# Patient Record
Sex: Female | Born: 1960 | Race: Black or African American | Hispanic: No | Marital: Married | Smoking: Never smoker
Health system: Southern US, Community
[De-identification: ages and names within clinical notes are randomized; demographics above are authoritative.]

## PROBLEM LIST (undated history)

## (undated) DIAGNOSIS — J45909 Unspecified asthma, uncomplicated: Secondary | ICD-10-CM

## (undated) DIAGNOSIS — K439 Ventral hernia without obstruction or gangrene: Secondary | ICD-10-CM

## (undated) DIAGNOSIS — Z923 Personal history of irradiation: Secondary | ICD-10-CM

## (undated) DIAGNOSIS — C50919 Malignant neoplasm of unspecified site of unspecified female breast: Secondary | ICD-10-CM

## (undated) DIAGNOSIS — K219 Gastro-esophageal reflux disease without esophagitis: Secondary | ICD-10-CM

## (undated) DIAGNOSIS — E039 Hypothyroidism, unspecified: Secondary | ICD-10-CM

## (undated) DIAGNOSIS — C801 Malignant (primary) neoplasm, unspecified: Secondary | ICD-10-CM

## (undated) HISTORY — PX: ENDOMETRIAL BIOPSY: SHX622

## (undated) HISTORY — PX: HERNIA REPAIR: SHX51

## (undated) HISTORY — DX: Unspecified asthma, uncomplicated: J45.909

## (undated) HISTORY — PX: COLONOSCOPY: SHX174

---

## 2005-03-19 ENCOUNTER — Ambulatory Visit: Payer: Self-pay | Admitting: Family Medicine

## 2006-10-21 ENCOUNTER — Other Ambulatory Visit: Payer: Self-pay

## 2006-10-21 ENCOUNTER — Ambulatory Visit: Payer: Self-pay | Admitting: Surgery

## 2006-10-25 ENCOUNTER — Ambulatory Visit: Payer: Self-pay | Admitting: Surgery

## 2009-02-10 ENCOUNTER — Ambulatory Visit: Payer: Self-pay | Admitting: Family Medicine

## 2010-02-20 ENCOUNTER — Ambulatory Visit: Payer: Self-pay | Admitting: Family Medicine

## 2011-02-22 ENCOUNTER — Ambulatory Visit: Payer: Self-pay | Admitting: Family Medicine

## 2012-02-26 ENCOUNTER — Ambulatory Visit: Payer: Self-pay

## 2013-03-11 ENCOUNTER — Ambulatory Visit: Payer: Self-pay

## 2013-03-21 DIAGNOSIS — E669 Obesity, unspecified: Secondary | ICD-10-CM | POA: Insufficient documentation

## 2013-03-21 DIAGNOSIS — J309 Allergic rhinitis, unspecified: Secondary | ICD-10-CM | POA: Insufficient documentation

## 2014-03-12 ENCOUNTER — Ambulatory Visit: Payer: Self-pay | Admitting: Family Medicine

## 2015-02-10 ENCOUNTER — Other Ambulatory Visit: Payer: Self-pay | Admitting: Family Medicine

## 2015-02-10 DIAGNOSIS — Z1231 Encounter for screening mammogram for malignant neoplasm of breast: Secondary | ICD-10-CM

## 2015-03-16 ENCOUNTER — Ambulatory Visit
Admission: RE | Admit: 2015-03-16 | Discharge: 2015-03-16 | Disposition: A | Discharge status: Home / Self Care | Payer: BC Managed Care – PPO | Source: Ambulatory Visit | Attending: Family Medicine | Admitting: Family Medicine

## 2015-03-16 DIAGNOSIS — Z1231 Encounter for screening mammogram for malignant neoplasm of breast: Secondary | ICD-10-CM | POA: Diagnosis not present

## 2016-02-10 ENCOUNTER — Other Ambulatory Visit: Payer: Self-pay | Admitting: Family Medicine

## 2016-02-10 DIAGNOSIS — Z1231 Encounter for screening mammogram for malignant neoplasm of breast: Secondary | ICD-10-CM

## 2016-03-19 ENCOUNTER — Ambulatory Visit: Payer: BC Managed Care – PPO

## 2016-03-26 ENCOUNTER — Ambulatory Visit
Admission: RE | Admit: 2016-03-26 | Discharge: 2016-03-26 | Disposition: A | Discharge status: Home / Self Care | Payer: BC Managed Care – PPO | Source: Ambulatory Visit | Attending: Family Medicine | Admitting: Family Medicine

## 2016-03-26 ENCOUNTER — Other Ambulatory Visit: Payer: Self-pay | Admitting: Family Medicine

## 2016-03-26 DIAGNOSIS — Z1231 Encounter for screening mammogram for malignant neoplasm of breast: Secondary | ICD-10-CM

## 2017-01-15 ENCOUNTER — Other Ambulatory Visit: Payer: Self-pay | Admitting: Internal Medicine

## 2017-01-15 DIAGNOSIS — E0590 Thyrotoxicosis, unspecified without thyrotoxic crisis or storm: Secondary | ICD-10-CM

## 2017-01-22 ENCOUNTER — Encounter
Admission: RE | Admit: 2017-01-22 | Discharge: 2017-01-22 | Disposition: A | Discharge status: Home / Self Care | Payer: BC Managed Care – PPO | Source: Ambulatory Visit | Attending: Internal Medicine | Admitting: Internal Medicine

## 2017-01-22 ENCOUNTER — Other Ambulatory Visit: Payer: Self-pay | Admitting: Internal Medicine

## 2017-01-22 ENCOUNTER — Ambulatory Visit
Admission: RE | Admit: 2017-01-22 | Discharge: 2017-01-22 | Disposition: A | Discharge status: Home / Self Care | Payer: BC Managed Care – PPO | Source: Ambulatory Visit | Attending: Internal Medicine | Admitting: Internal Medicine

## 2017-01-22 DIAGNOSIS — E0590 Thyrotoxicosis, unspecified without thyrotoxic crisis or storm: Secondary | ICD-10-CM

## 2017-01-22 MED ORDER — SODIUM IODIDE I-123 7.4 MBQ CAPS
143.8250 | ORAL_CAPSULE | Freq: Once | ORAL | Status: AC
  Administered 2017-01-22: 143.825 via ORAL

## 2017-01-23 ENCOUNTER — Encounter
Admission: RE | Admit: 2017-01-23 | Discharge: 2017-01-23 | Disposition: A | Discharge status: Home / Self Care | Payer: BC Managed Care – PPO | Source: Ambulatory Visit | Attending: Internal Medicine | Admitting: Internal Medicine

## 2017-02-06 ENCOUNTER — Other Ambulatory Visit: Payer: Self-pay | Admitting: Family Medicine

## 2017-02-06 DIAGNOSIS — Z1239 Encounter for other screening for malignant neoplasm of breast: Secondary | ICD-10-CM

## 2017-02-13 ENCOUNTER — Other Ambulatory Visit: Payer: Self-pay | Admitting: Internal Medicine

## 2017-02-13 DIAGNOSIS — E0590 Thyrotoxicosis, unspecified without thyrotoxic crisis or storm: Secondary | ICD-10-CM

## 2017-02-26 ENCOUNTER — Ambulatory Visit
Admission: RE | Admit: 2017-02-26 | Discharge: 2017-02-26 | Disposition: A | Discharge status: Home / Self Care | Payer: BC Managed Care – PPO | Source: Ambulatory Visit | Attending: Internal Medicine | Admitting: Internal Medicine

## 2017-02-26 DIAGNOSIS — E041 Nontoxic single thyroid nodule: Secondary | ICD-10-CM | POA: Insufficient documentation

## 2017-02-26 DIAGNOSIS — E0590 Thyrotoxicosis, unspecified without thyrotoxic crisis or storm: Secondary | ICD-10-CM | POA: Insufficient documentation

## 2017-02-26 MED ORDER — SODIUM IODIDE I 131 CAPSULE
25.7000 | Freq: Once | INTRAVENOUS | Status: AC | PRN
  Administered 2017-02-26: 25.7 via ORAL

## 2017-03-22 ENCOUNTER — Other Ambulatory Visit: Payer: Self-pay | Admitting: Family Medicine

## 2017-03-22 DIAGNOSIS — N950 Postmenopausal bleeding: Secondary | ICD-10-CM

## 2017-03-26 ENCOUNTER — Other Ambulatory Visit: Payer: Self-pay | Admitting: Family Medicine

## 2017-03-26 DIAGNOSIS — N950 Postmenopausal bleeding: Secondary | ICD-10-CM

## 2017-03-27 ENCOUNTER — Ambulatory Visit
Admission: RE | Admit: 2017-03-27 | Discharge: 2017-03-27 | Disposition: A | Discharge status: Home / Self Care | Payer: BC Managed Care – PPO | Source: Ambulatory Visit | Attending: Family Medicine | Admitting: Family Medicine

## 2017-03-27 DIAGNOSIS — Z1231 Encounter for screening mammogram for malignant neoplasm of breast: Secondary | ICD-10-CM | POA: Insufficient documentation

## 2017-03-27 DIAGNOSIS — Z1239 Encounter for other screening for malignant neoplasm of breast: Secondary | ICD-10-CM

## 2017-04-02 ENCOUNTER — Ambulatory Visit
Admission: RE | Admit: 2017-04-02 | Discharge: 2017-04-02 | Disposition: A | Discharge status: Home / Self Care | Payer: BC Managed Care – PPO | Source: Ambulatory Visit | Attending: Family Medicine | Admitting: Family Medicine

## 2017-04-02 DIAGNOSIS — N950 Postmenopausal bleeding: Secondary | ICD-10-CM | POA: Diagnosis not present

## 2017-04-02 DIAGNOSIS — R938 Abnormal findings on diagnostic imaging of other specified body structures: Secondary | ICD-10-CM | POA: Diagnosis not present

## 2018-02-13 ENCOUNTER — Other Ambulatory Visit: Payer: Self-pay | Admitting: Family Medicine

## 2018-02-13 DIAGNOSIS — Z1231 Encounter for screening mammogram for malignant neoplasm of breast: Secondary | ICD-10-CM

## 2018-04-01 ENCOUNTER — Ambulatory Visit
Admission: RE | Admit: 2018-04-01 | Discharge: 2018-04-01 | Disposition: A | Discharge status: Home / Self Care | Payer: BC Managed Care – PPO | Source: Ambulatory Visit | Attending: Family Medicine | Admitting: Family Medicine

## 2018-04-01 DIAGNOSIS — Z1231 Encounter for screening mammogram for malignant neoplasm of breast: Secondary | ICD-10-CM | POA: Diagnosis present

## 2019-02-02 ENCOUNTER — Other Ambulatory Visit: Payer: Self-pay | Admitting: Family Medicine

## 2019-02-02 DIAGNOSIS — Z1231 Encounter for screening mammogram for malignant neoplasm of breast: Secondary | ICD-10-CM

## 2019-02-13 IMAGING — US US PELVIS COMPLETE
1 series · 13 of 25 positions shown · non-contrast
Comparison: None

CLINICAL DATA: Postmenopausal bleeding

EXAM:
TRANSABDOMINAL AND TRANSVAGINAL ULTRASOUND OF PELVIS
TECHNIQUE: Both transabdominal and transvaginal ultrasound examinations of the
pelvis were performed. Transabdominal technique was performed for
global imaging of the pelvis including uterus, ovaries, adnexal
regions, and pelvic cul-de-sac. It was necessary to proceed with
endovaginal exam following the transabdominal exam to visualize the
ovaries and endometrium.

[Series 1: us pelvis complete · 0.24mm/px · 13 of 69 slices shown]
[im 1/69]
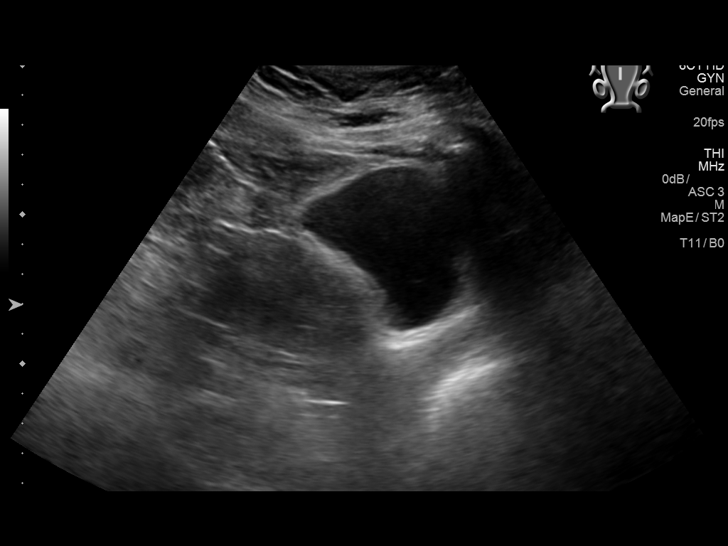
[im 6/69]
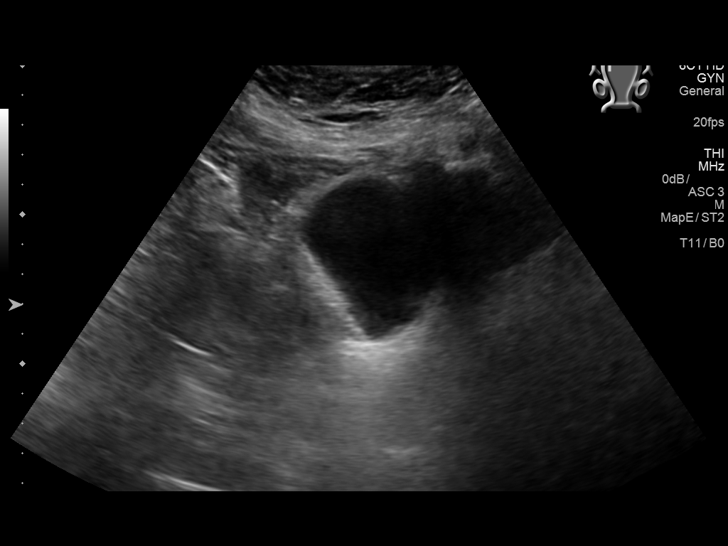
[im 12/69]
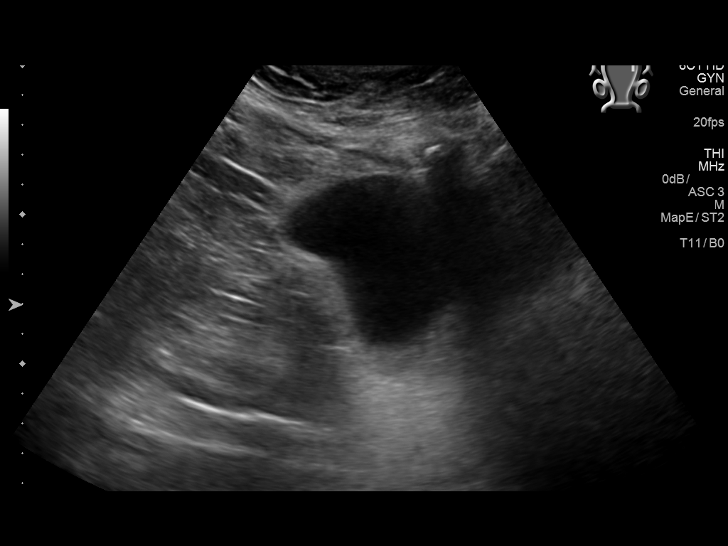
[im 18/69]
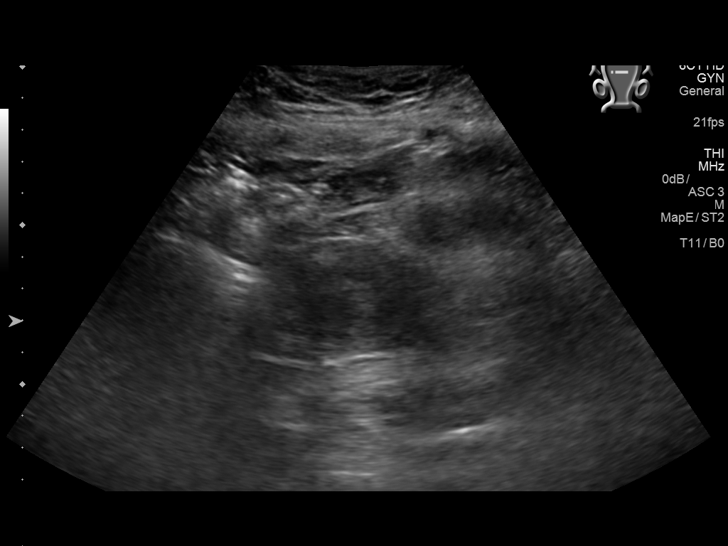
[im 23/69]
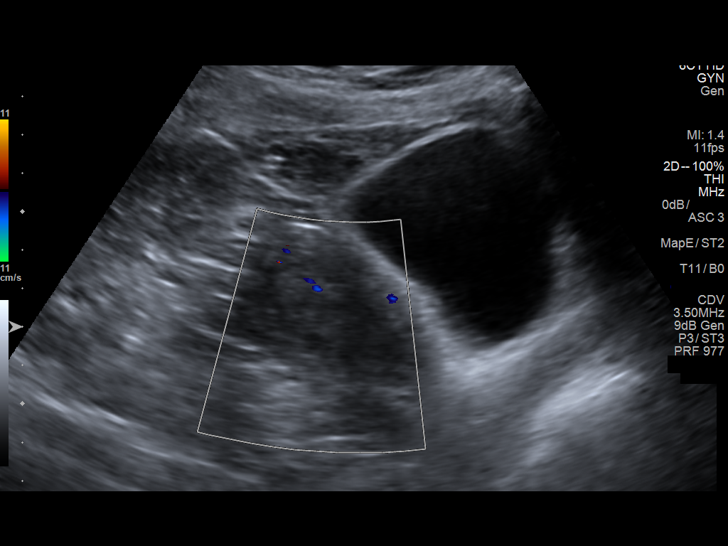
[im 29/69]
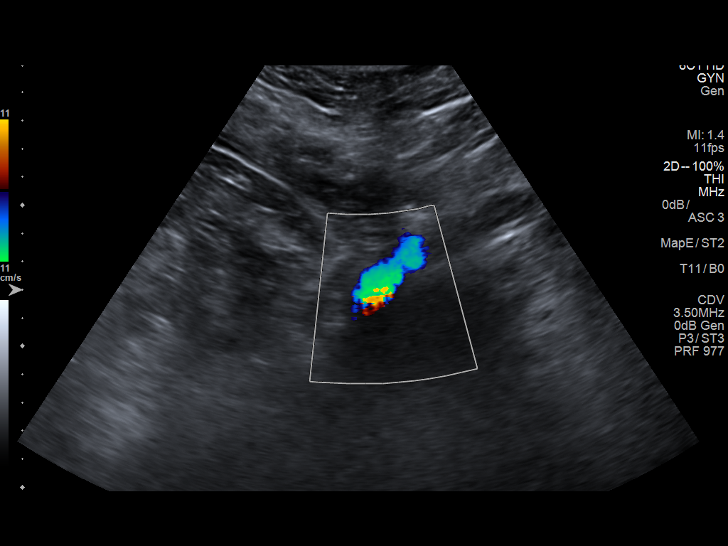
[im 35/69]
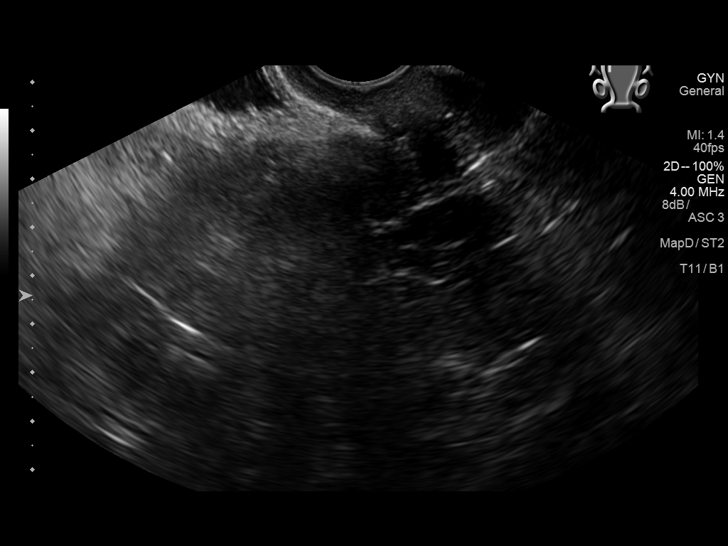
[im 40/69]
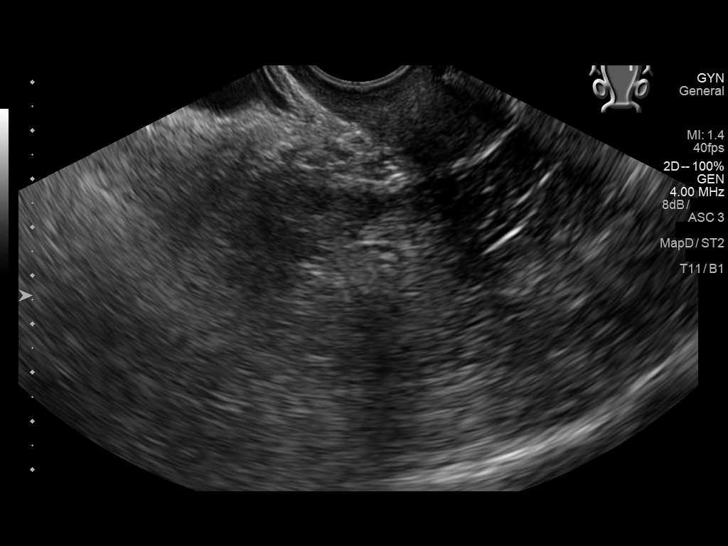
[im 46/69]
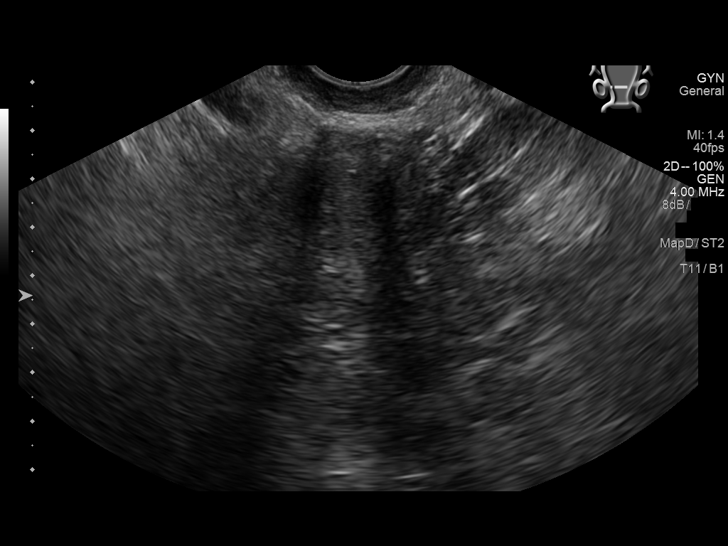
[im 52/69]
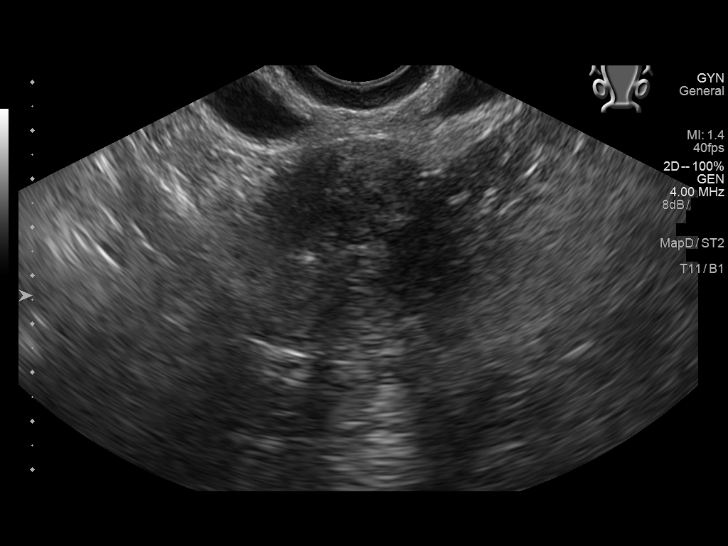
[im 57/69]
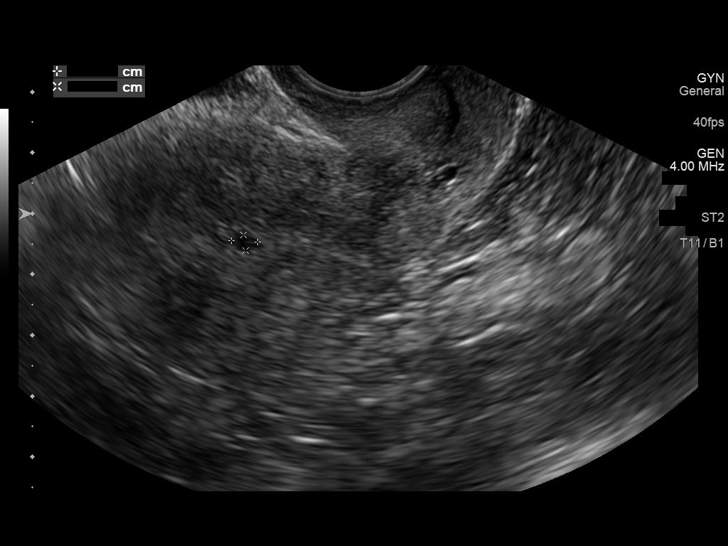
[im 63/69]
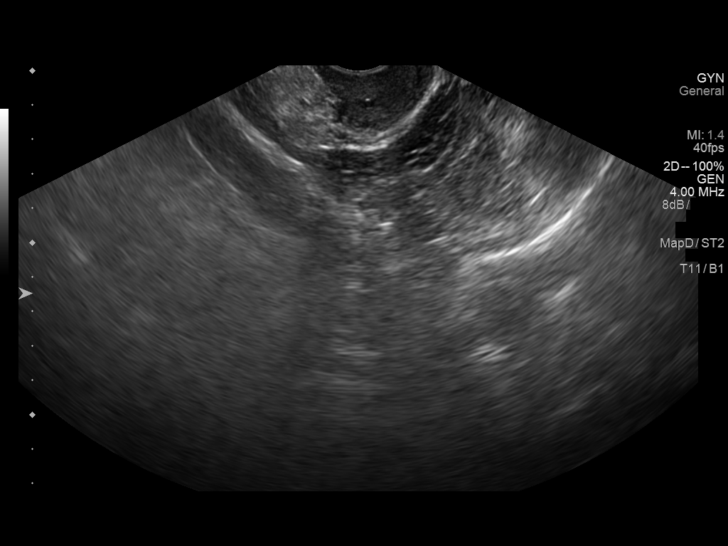
[im 69/69]
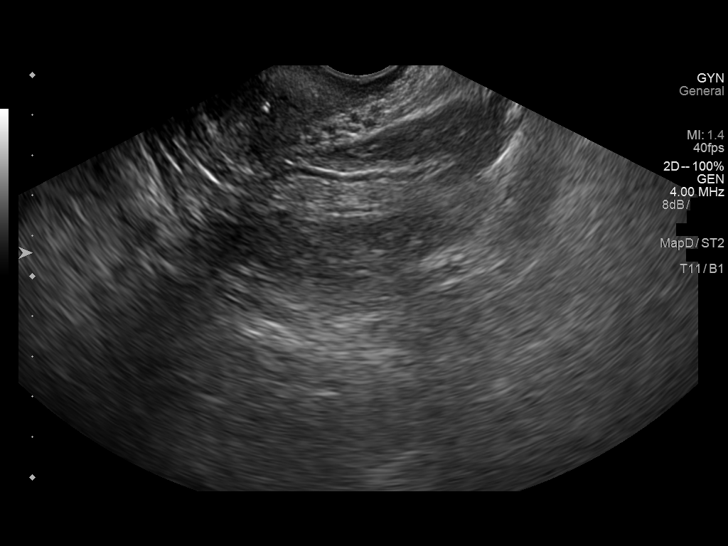

[13 of 25 positions shown; findings below may reference images not displayed]

FINDINGS: Uterus

Measurements: 8.5 x 4.4 x 4.6 cm.. No fibroids or other mass
visualized.

Endometrium

Thickness: 14 mm. The endometrium is thickened with some cystic
areas within. Increased blood flow is noted.

Right ovary

Not well seen

Left ovary

Not well seen

Other findings

No abnormal free fluid.
IMPRESSION: Thickened endometrium with some cystic areas and increased blood
flow within. In the setting of post-menopausal bleeding, endometrial
sampling is indicated to exclude carcinoma. If results are benign,
sonohysterogram should be considered for focal lesion work-up. (Ref:
Radiological Reasoning: Algorithmic Workup of Abnormal Vaginal
Bleeding with Endovaginal Sonography and Sonohysterography. AJR
[id number]; 191:S68-73)

## 2019-02-24 DIAGNOSIS — E890 Postprocedural hypothyroidism: Secondary | ICD-10-CM | POA: Insufficient documentation

## 2019-04-06 ENCOUNTER — Other Ambulatory Visit: Payer: Self-pay

## 2019-04-06 ENCOUNTER — Ambulatory Visit
Admission: RE | Admit: 2019-04-06 | Discharge: 2019-04-06 | Disposition: A | Discharge status: Home / Self Care | Payer: BC Managed Care – PPO | Source: Ambulatory Visit | Attending: Family Medicine | Admitting: Family Medicine

## 2019-04-06 DIAGNOSIS — Z1231 Encounter for screening mammogram for malignant neoplasm of breast: Secondary | ICD-10-CM | POA: Insufficient documentation

## 2020-01-15 ENCOUNTER — Ambulatory Visit
Admission: RE | Admit: 2020-01-15 | Discharge: 2020-01-15 | Disposition: A | Discharge status: Home / Self Care | Payer: BC Managed Care – PPO | Attending: Physician Assistant | Admitting: Physician Assistant

## 2020-01-15 ENCOUNTER — Other Ambulatory Visit: Payer: Self-pay

## 2020-01-15 ENCOUNTER — Other Ambulatory Visit: Payer: Self-pay | Admitting: Physician Assistant

## 2020-01-15 ENCOUNTER — Ambulatory Visit
Admission: RE | Admit: 2020-01-15 | Discharge: 2020-01-15 | Disposition: A | Discharge status: Home / Self Care | Payer: BC Managed Care – PPO | Source: Ambulatory Visit | Attending: Physician Assistant | Admitting: Physician Assistant

## 2020-01-15 DIAGNOSIS — R1012 Left upper quadrant pain: Secondary | ICD-10-CM

## 2020-02-05 ENCOUNTER — Other Ambulatory Visit: Payer: Self-pay | Admitting: Anesthesiology

## 2020-02-05 ENCOUNTER — Other Ambulatory Visit: Payer: Self-pay | Admitting: Family Medicine

## 2020-02-05 DIAGNOSIS — Z1231 Encounter for screening mammogram for malignant neoplasm of breast: Secondary | ICD-10-CM

## 2020-04-06 ENCOUNTER — Ambulatory Visit
Admission: RE | Admit: 2020-04-06 | Discharge: 2020-04-06 | Disposition: A | Discharge status: Home / Self Care | Payer: BC Managed Care – PPO | Source: Ambulatory Visit | Attending: Family Medicine | Admitting: Family Medicine

## 2020-04-06 ENCOUNTER — Other Ambulatory Visit: Payer: Self-pay

## 2020-04-06 DIAGNOSIS — Z1231 Encounter for screening mammogram for malignant neoplasm of breast: Secondary | ICD-10-CM | POA: Diagnosis not present

## 2020-12-29 ENCOUNTER — Other Ambulatory Visit: Payer: Self-pay | Admitting: Family Medicine

## 2020-12-29 DIAGNOSIS — Z1231 Encounter for screening mammogram for malignant neoplasm of breast: Secondary | ICD-10-CM

## 2021-04-11 ENCOUNTER — Other Ambulatory Visit: Payer: Self-pay

## 2021-04-11 ENCOUNTER — Ambulatory Visit
Admission: RE | Admit: 2021-04-11 | Discharge: 2021-04-11 | Disposition: A | Discharge status: Home / Self Care | Payer: BC Managed Care – PPO | Source: Ambulatory Visit | Attending: Family Medicine | Admitting: Family Medicine

## 2021-04-11 DIAGNOSIS — Z1231 Encounter for screening mammogram for malignant neoplasm of breast: Secondary | ICD-10-CM | POA: Diagnosis present

## 2021-04-13 ENCOUNTER — Other Ambulatory Visit: Payer: Self-pay | Admitting: Family Medicine

## 2021-04-17 ENCOUNTER — Other Ambulatory Visit: Payer: Self-pay | Admitting: Family Medicine

## 2021-04-17 DIAGNOSIS — R928 Other abnormal and inconclusive findings on diagnostic imaging of breast: Secondary | ICD-10-CM

## 2021-04-17 DIAGNOSIS — N6320 Unspecified lump in the left breast, unspecified quadrant: Secondary | ICD-10-CM

## 2021-04-21 ENCOUNTER — Ambulatory Visit
Admission: RE | Admit: 2021-04-21 | Discharge: 2021-04-21 | Disposition: A | Discharge status: Home / Self Care | Payer: BC Managed Care – PPO | Source: Ambulatory Visit | Attending: Family Medicine | Admitting: Family Medicine

## 2021-04-21 ENCOUNTER — Other Ambulatory Visit: Payer: Self-pay

## 2021-04-21 DIAGNOSIS — N6320 Unspecified lump in the left breast, unspecified quadrant: Secondary | ICD-10-CM

## 2021-04-21 DIAGNOSIS — R928 Other abnormal and inconclusive findings on diagnostic imaging of breast: Secondary | ICD-10-CM | POA: Insufficient documentation

## 2021-04-27 ENCOUNTER — Other Ambulatory Visit: Payer: Self-pay | Admitting: Family Medicine

## 2021-04-27 DIAGNOSIS — N6320 Unspecified lump in the left breast, unspecified quadrant: Secondary | ICD-10-CM

## 2021-04-27 DIAGNOSIS — R928 Other abnormal and inconclusive findings on diagnostic imaging of breast: Secondary | ICD-10-CM

## 2021-05-05 ENCOUNTER — Ambulatory Visit: Payer: BC Managed Care – PPO

## 2021-05-10 ENCOUNTER — Ambulatory Visit
Admission: RE | Admit: 2021-05-10 | Discharge: 2021-05-10 | Disposition: A | Discharge status: Home / Self Care | Payer: BC Managed Care – PPO | Source: Ambulatory Visit | Attending: Family Medicine | Admitting: Family Medicine

## 2021-05-10 ENCOUNTER — Other Ambulatory Visit: Payer: Self-pay

## 2021-05-10 DIAGNOSIS — R928 Other abnormal and inconclusive findings on diagnostic imaging of breast: Secondary | ICD-10-CM | POA: Insufficient documentation

## 2021-05-10 DIAGNOSIS — N6320 Unspecified lump in the left breast, unspecified quadrant: Secondary | ICD-10-CM

## 2021-05-10 HISTORY — PX: BREAST BIOPSY: SHX20

## 2021-05-12 ENCOUNTER — Other Ambulatory Visit: Payer: Self-pay

## 2021-05-12 DIAGNOSIS — C50919 Malignant neoplasm of unspecified site of unspecified female breast: Secondary | ICD-10-CM

## 2021-05-18 ENCOUNTER — Telehealth: Payer: Self-pay

## 2021-05-18 ENCOUNTER — Encounter: Payer: Self-pay | Admitting: General Surgery

## 2021-05-18 ENCOUNTER — Ambulatory Visit: Payer: BC Managed Care – PPO | Admitting: General Surgery

## 2021-05-18 ENCOUNTER — Other Ambulatory Visit: Payer: Self-pay

## 2021-05-18 VITALS — BP 137/86 | HR 84 | Temp 98.7°F | Ht 68.0 in | Wt 257.6 lb

## 2021-05-18 DIAGNOSIS — C50212 Malignant neoplasm of upper-inner quadrant of left female breast: Secondary | ICD-10-CM

## 2021-05-18 DIAGNOSIS — J45909 Unspecified asthma, uncomplicated: Secondary | ICD-10-CM

## 2021-05-18 DIAGNOSIS — Z170 Estrogen receptor positive status [ER+]: Secondary | ICD-10-CM | POA: Diagnosis not present

## 2021-05-18 NOTE — Patient Instructions (Addendum)

## 2021-05-18 NOTE — H&P (View-Only) (Signed)
Patient ID: Breanna [name], female   DOB: Jul 19, 1961, 60 y.o.   MRN: [id number]  Chief Complaint  Patient presents with   New Patient (Initial Visit)    Left breast cancer    HPI Breanna [name] is a 60 y.o. female.   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  Prognostic indicators (ER/PR/HER2/neu) are not yet available.  The lesion was detected on screening mammography performed on April 11, 2021.  Her prior mammogram was 1 year ago and was BI-RADS 1.  Due to the suspicious finding on screening mammogram, she underwent diagnostic imaging and ultrasound, followed by stereotactic core biopsy.  The pathology from the biopsy revealed the following:  SURGICAL PATHOLOGY  CASE: [id number]  PATIENT: Breanna [name]  Surgical Pathology Report      Specimen Submitted:  A. Breast, left, upper inner, biopsy   Clinical History: Suspicious 3 mm mass upper inner left breast,  sonographically occult.  Post biopsy mammograms show appropriate approximate positioning of the  COIL shaped biopsy  marking clip at the site of biopsy in the LEFT upper inner breast, but  the biopsied mass is obscured by hematoma.      DIAGNOSIS:  A. BREAST, LEFT, UPPER INNER; STEREOTACTIC-GUIDED CORE BIOPSY:  - INVASIVE MAMMARY CARCINOMA, NO SPECIAL TYPE.  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   Size of invasive carcinoma: 2.5 mm in this sample  Histologic grade of invasive carcinoma: Grade 1                       Glandular/tubular differentiation score: 1                       Nuclear pleomorphism score: 1-2                       Mitotic rate score: 1                       Total score: 3-4  Ductal carcinoma in situ: Not identified  Lymphovascular invasion: Not identified   ER/PR/HER2: Immunohistochemistry will be performed on block A3, with  reflex to [place] for HER2 2+. The results will be reported in  an addendum.   Comment:  The definitive grade will be assigned on the excisional specimen.  These findings were communicated to Electa Sniff in Dr. Rennis Harding office  on 05/11/2021.      Past Medical History:  Diagnosis Date   Asthma     Past Surgical History:  Procedure Laterality Date   BREAST BIOPSY Left 05/10/2021   Affirm bx- mass/"coil" marker-path pending   CESAREAN SECTION     HERNIA REPAIR      Family History  Problem Relation Age of Onset   Breast cancer Mother 78   Breast cancer Cousin 55       pat cousin    Social History Social History   Tobacco Use   Smoking status: Never   Smokeless tobacco: Never  Substance Use Topics   Alcohol use: Never   Drug use: Never    Allergies  Allergen Reactions   Sudafed [Pseudoephedrine Hcl] Shortness Of Breath   Latex Itching    Current Outpatient Medications  Medication Sig Dispense Refill   albuterol (VENTOLIN HFA) 108 (90 Base) MCG/ACT inhaler Inhale into the lungs.     budesonide-formoterol (SYMBICORT)  160-4.5 MCG/ACT inhaler INHALE 2 PUFFS TWO TIMES A DAY.     Calcium Carbonate-Vitamin D 600-200 MG-UNIT TABS Take by mouth.     Cetirizine HCl 10 MG CAPS Take by mouth.     Cholecalciferol 50 MCG (2000 UT) CAPS Take by mouth.     levothyroxine (SYNTHROID) 75 MCG tablet Take 75 mcg by mouth daily.     montelukast (SINGULAIR) 10 MG tablet TAKE 1 TABLET BY MOUTH EVERY DAY AT NIGHT     Spacer/Aero-Holding Chambers (AEROCHAMBER MV) inhaler 1 each by Miscellaneous route two (2) times a day as needed.     triamcinolone cream (KENALOG) 0.1 % SMARTSIG:Sparingly Topical Twice a Week     No current facility-administered medications for this visit.    Review of Systems Review of Systems  All other systems reviewed and are negative.  Blood pressure 137/86, pulse 84, temperature 98.7 F (37.1 C), temperature source Oral, height _0  (1.727 m), weight 257 lb 9.6 oz (116.8 kg), SpO2 94 %.  Physical Exam Physical  Exam Vitals reviewed. Exam conducted with a chaperone present.  Constitutional:      General: She is not in acute distress.    Appearance: She is obese.  HENT:     Head: Normocephalic and atraumatic.     Nose:     Comments: Covered with a mask    Mouth/Throat:     Comments: Covered with a mask Eyes:     General: No scleral icterus.       Right eye: No discharge.        Left eye: No discharge.  Cardiovascular:     Rate and Rhythm: Normal rate and regular rhythm.  Chest:  Breasts:    Breasts are symmetrical.     Right: Normal.     Left: Normal.       Comments: Biopsy site left breast. Lymphadenopathy:     Upper Body:     Right upper body: No supraclavicular, axillary or pectoral adenopathy.     Left upper body: No supraclavicular, axillary or pectoral adenopathy.  Neurological:     Mental Status: She is alert.    Data Reviewed Pathology and imaging as per HPI  Assessment 60 y/o F with left breast cancer, prognostic markers pending.  Plan I have recommended RFID-guided lumpectomy with SLNB.  The risks were discussed and she agreed to proceed.  Will work on scheduling OR.    Fredirick Maudlin 05/18/2021, 4:12 PM

## 2021-05-18 NOTE — Telephone Encounter (Signed)
Error

## 2021-05-18 NOTE — Telephone Encounter (Signed)
Faxed Pulmonary Clearance to Dr. Ivar Drape at [phone].

## 2021-05-18 NOTE — Progress Notes (Signed)
Patient ID: Breanna [name], female   DOB: Jul 19, 1961, 60 y.o.   MRN: [id number]  Chief Complaint  Patient presents with   New Patient (Initial Visit)    Left breast cancer    HPI Breanna [name] is a 60 y.o. female.   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  Prognostic indicators (ER/PR/HER2/neu) are not yet available.  The lesion was detected on screening mammography performed on April 11, 2021.  Her prior mammogram was 1 year ago and was BI-RADS 1.  Due to the suspicious finding on screening mammogram, she underwent diagnostic imaging and ultrasound, followed by stereotactic core biopsy.  The pathology from the biopsy revealed the following:  SURGICAL PATHOLOGY  CASE: [id number]  PATIENT: Breanna [name]  Surgical Pathology Report      Specimen Submitted:  A. Breast, left, upper inner, biopsy   Clinical History: Suspicious 3 mm mass upper inner left breast,  sonographically occult.  Post biopsy mammograms show appropriate approximate positioning of the  COIL shaped biopsy  marking clip at the site of biopsy in the LEFT upper inner breast, but  the biopsied mass is obscured by hematoma.      DIAGNOSIS:  A. BREAST, LEFT, UPPER INNER; STEREOTACTIC-GUIDED CORE BIOPSY:  - INVASIVE MAMMARY CARCINOMA, NO SPECIAL TYPE.  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   Size of invasive carcinoma: 2.5 mm in this sample  Histologic grade of invasive carcinoma: Grade 1                       Glandular/tubular differentiation score: 1                       Nuclear pleomorphism score: 1-2                       Mitotic rate score: 1                       Total score: 3-4  Ductal carcinoma in situ: Not identified  Lymphovascular invasion: Not identified   ER/PR/HER2: Immunohistochemistry will be performed on block A3, with  reflex to [place] for HER2 2+. The results will be reported in  an addendum.   Comment:  The definitive grade will be assigned on the excisional specimen.  These findings were communicated to Electa Sniff in Dr. Rennis Harding office  on 05/11/2021.      Past Medical History:  Diagnosis Date   Asthma     Past Surgical History:  Procedure Laterality Date   BREAST BIOPSY Left 05/10/2021   Affirm bx- mass/"coil" marker-path pending   CESAREAN SECTION     HERNIA REPAIR      Family History  Problem Relation Age of Onset   Breast cancer Mother 78   Breast cancer Cousin 55       pat cousin    Social History Social History   Tobacco Use   Smoking status: Never   Smokeless tobacco: Never  Substance Use Topics   Alcohol use: Never   Drug use: Never    Allergies  Allergen Reactions   Sudafed [Pseudoephedrine Hcl] Shortness Of Breath   Latex Itching    Current Outpatient Medications  Medication Sig Dispense Refill   albuterol (VENTOLIN HFA) 108 (90 Base) MCG/ACT inhaler Inhale into the lungs.     budesonide-formoterol (SYMBICORT)  160-4.5 MCG/ACT inhaler INHALE 2 PUFFS TWO TIMES A DAY.     Calcium Carbonate-Vitamin D 600-200 MG-UNIT TABS Take by mouth.     Cetirizine HCl 10 MG CAPS Take by mouth.     Cholecalciferol 50 MCG (2000 UT) CAPS Take by mouth.     levothyroxine (SYNTHROID) 75 MCG tablet Take 75 mcg by mouth daily.     montelukast (SINGULAIR) 10 MG tablet TAKE 1 TABLET BY MOUTH EVERY DAY AT NIGHT     Spacer/Aero-Holding Chambers (AEROCHAMBER MV) inhaler 1 each by Miscellaneous route two (2) times a day as needed.     triamcinolone cream (KENALOG) 0.1 % SMARTSIG:Sparingly Topical Twice a Week     No current facility-administered medications for this visit.    Review of Systems Review of Systems  All other systems reviewed and are negative.  Blood pressure 137/86, pulse 84, temperature 98.7 F (37.1 C), temperature source Oral, height _0  (1.727 m), weight 257 lb 9.6 oz (116.8 kg), SpO2 94 %.  Physical Exam Physical  Exam Vitals reviewed. Exam conducted with a chaperone present.  Constitutional:      General: She is not in acute distress.    Appearance: She is obese.  HENT:     Head: Normocephalic and atraumatic.     Nose:     Comments: Covered with a mask    Mouth/Throat:     Comments: Covered with a mask Eyes:     General: No scleral icterus.       Right eye: No discharge.        Left eye: No discharge.  Cardiovascular:     Rate and Rhythm: Normal rate and regular rhythm.  Chest:  Breasts:    Breasts are symmetrical.     Right: Normal.     Left: Normal.       Comments: Biopsy site left breast. Lymphadenopathy:     Upper Body:     Right upper body: No supraclavicular, axillary or pectoral adenopathy.     Left upper body: No supraclavicular, axillary or pectoral adenopathy.  Neurological:     Mental Status: She is alert.    Data Reviewed Pathology and imaging as per HPI  Assessment 60 y/o F with left breast cancer, prognostic markers pending.  Plan I have recommended RFID-guided lumpectomy with SLNB.  The risks were discussed and she agreed to proceed.  Will work on scheduling OR.    Fredirick Maudlin 05/18/2021, 4:12 PM

## 2021-05-19 ENCOUNTER — Telehealth: Payer: Self-pay | Admitting: General Surgery

## 2021-05-19 NOTE — Telephone Encounter (Signed)
Left message for patient to call me so that we can go over dates for scheduling of her surgery with Dr. Celine Ahr.

## 2021-05-22 ENCOUNTER — Other Ambulatory Visit: Payer: Self-pay | Admitting: General Surgery

## 2021-05-22 ENCOUNTER — Telehealth: Payer: Self-pay | Admitting: General Surgery

## 2021-05-22 ENCOUNTER — Other Ambulatory Visit: Payer: BC Managed Care – PPO

## 2021-05-22 ENCOUNTER — Ambulatory Visit: Payer: BC Managed Care – PPO | Admitting: Oncology

## 2021-05-22 DIAGNOSIS — C50919 Malignant neoplasm of unspecified site of unspecified female breast: Secondary | ICD-10-CM

## 2021-05-22 LAB — SURGICAL PATHOLOGY

## 2021-05-22 NOTE — Telephone Encounter (Signed)
To date, multiple messages have been left for patient to call me regarding scheduling of her surgery with Dr. Celine Ahr.

## 2021-05-23 ENCOUNTER — Inpatient Hospital Stay: Payer: BC Managed Care – PPO

## 2021-05-23 ENCOUNTER — Other Ambulatory Visit: Payer: Self-pay | Admitting: General Surgery

## 2021-05-23 ENCOUNTER — Encounter: Payer: Self-pay | Admitting: Oncology

## 2021-05-23 ENCOUNTER — Inpatient Hospital Stay: Payer: BC Managed Care – PPO | Attending: Oncology | Admitting: Oncology

## 2021-05-23 VITALS — BP 153/78 | HR 77 | Temp 97.8°F | Resp 19 | Wt 258.3 lb

## 2021-05-23 DIAGNOSIS — Z809 Family history of malignant neoplasm, unspecified: Secondary | ICD-10-CM

## 2021-05-23 DIAGNOSIS — Z7189 Other specified counseling: Secondary | ICD-10-CM

## 2021-05-23 DIAGNOSIS — Z170 Estrogen receptor positive status [ER+]: Secondary | ICD-10-CM | POA: Diagnosis not present

## 2021-05-23 DIAGNOSIS — C50212 Malignant neoplasm of upper-inner quadrant of left female breast: Secondary | ICD-10-CM | POA: Diagnosis not present

## 2021-05-23 DIAGNOSIS — Z803 Family history of malignant neoplasm of breast: Secondary | ICD-10-CM | POA: Insufficient documentation

## 2021-05-23 DIAGNOSIS — C50919 Malignant neoplasm of unspecified site of unspecified female breast: Secondary | ICD-10-CM

## 2021-05-23 DIAGNOSIS — C50912 Malignant neoplasm of unspecified site of left female breast: Secondary | ICD-10-CM

## 2021-05-23 LAB — COMPREHENSIVE METABOLIC PANEL
ALT: 10 U/L (ref 0–44)
AST: 14 U/L — ABNORMAL LOW (ref 15–41)
Albumin: 4.2 g/dL (ref 3.5–5.0)
Alkaline Phosphatase: 51 U/L (ref 38–126)
Anion gap: 9 (ref 5–15)
BUN: 17 mg/dL (ref 8–23)
CO2: 26 mmol/L (ref 22–32)
Calcium: 9.4 mg/dL (ref 8.9–10.3)
Chloride: 103 mmol/L (ref 98–111)
Creatinine, Ser: 0.82 mg/dL (ref 0.44–1.00)
GFR, Estimated: >60 mL/min (ref >60)
Glucose, Bld: 100 mg/dL — ABNORMAL HIGH (ref 70–99)
Potassium: 4.2 mmol/L (ref 3.5–5.1)
Sodium: 138 mmol/L (ref 135–145)
Total Bilirubin: 0.6 mg/dL (ref 0.3–1.2)
Total Protein: 8.4 g/dL — ABNORMAL HIGH (ref 6.5–8.1)

## 2021-05-23 LAB — CBC WITH DIFFERENTIAL/PLATELET
Abs Immature Granulocytes: 0.03 10*3/uL (ref 0.00–0.07)
Basophils Absolute: 0.1 10*3/uL (ref 0.0–0.1)
Basophils Relative: 1 %
Eosinophils Absolute: 0.4 10*3/uL (ref 0.0–0.5)
Eosinophils Relative: 5 %
HCT: 42.8 % (ref 36.0–46.0)
Hemoglobin: 13.9 g/dL (ref 12.0–15.0)
Immature Granulocytes: 0 %
Lymphocytes Relative: 27 %
Lymphs Abs: 2.5 10*3/uL (ref 0.7–4.0)
MCH: 30.2 pg (ref 26.0–34.0)
MCHC: 32.5 g/dL (ref 30.0–36.0)
MCV: 92.8 fL (ref 80.0–100.0)
Monocytes Absolute: 0.5 10*3/uL (ref 0.1–1.0)
Monocytes Relative: 6 %
Neutro Abs: 5.8 10*3/uL (ref 1.7–7.7)
Neutrophils Relative %: 61 %
Platelets: 333 10*3/uL (ref 150–400)
RBC: 4.61 MIL/uL (ref 3.87–5.11)
RDW: 14.1 % (ref 11.5–15.5)
WBC: 9.3 10*3/uL (ref 4.0–10.5)
nRBC: 0 % (ref 0.0–0.2)

## 2021-05-23 NOTE — Progress Notes (Signed)
Patient states no concerns at the moment. 

## 2021-05-23 NOTE — Progress Notes (Signed)
Hematology/Oncology Consult note [facility] [phone] [phone]   Patient Care Team: Verdie Shire, MD as PCP - General (Family Medicine)  REFERRING PROVIDER: Verdie Shire, MD  CHIEF COMPLAINTS/REASON FOR VISIT:  Evaluation of left breast cancer.  HISTORY OF PRESENTING ILLNESS:   Breanna [name] is a  60 y.o.  female with PMH listed below was seen in consultation at the request of  Bowen, Lauren, MD  for evaluation of left breast cancer 04/11/2021, bilateral screening mammogram recommend further evaluation for possible mass in the left breast. 04/21/2021, diagnostic unilateral left mammogram showed a 3 mm suspicious mass in the posterior upper inner left breast without sonographic correlate.  Left axillary shows normal lymph nodes. 9/14/ 2022, patient underwent upper inner left breast stereotactic guided core biopsy.  Invasive mammary carcinoma, no special type.  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 ER 90%, PR 90%, HER2 negative. Patient was referred to establish care with oncology.  She has met surgeon Dr. Celine Ahr. Menarche 18-11 yo Patient has 1 child Age at first birth, 78 She has remote history of birth control pill use. LMP 48 Denies any previous breast biopsies.  Denies any hormone replacement therapy.  Family history is positive for mother with breast cancer at age of 3.  Paternal cousin had breast cancer at age of 35.  Paternal uncle and had lung cancer.   Review of Systems  Constitutional:  Negative for appetite change, chills, fatigue and fever.  HENT:   Negative for hearing loss and voice change.   Eyes:  Negative for eye problems.  Respiratory:  Negative for chest tightness and cough.   Cardiovascular:  Negative for chest pain.  Gastrointestinal:  Negative for abdominal distention, abdominal pain and blood in stool.  Endocrine: Negative for hot flashes.  Genitourinary:  Negative for difficulty  urinating and frequency.   Musculoskeletal:  Negative for arthralgias.  Skin:  Negative for itching and rash.  Neurological:  Negative for extremity weakness.  Hematological:  Negative for adenopathy.  Psychiatric/Behavioral:  Negative for confusion.    MEDICAL HISTORY:  Past Medical History:  Diagnosis Date   Asthma     SURGICAL HISTORY: Past Surgical History:  Procedure Laterality Date   BREAST BIOPSY Left 05/10/2021   Affirm bx- mass/"coil" marker-path pending   CESAREAN SECTION     HERNIA REPAIR      SOCIAL HISTORY: Social History   Socioeconomic History   Marital status: Married    Spouse name: Not on file   Number of children: Not on file   Years of education: Not on file   Highest education level: Not on file  Occupational History   Not on file  Tobacco Use   Smoking status: Never   Smokeless tobacco: Never  Substance and Sexual Activity   Alcohol use: Never   Drug use: Never   Sexual activity: Not Currently    Birth control/protection: None  Other Topics Concern   Not on file  Social History Narrative   Not on file   Social Determinants of Health   Financial Resource Strain: Not on file  Food Insecurity: Not on file  Transportation Needs: Not on file  Physical Activity: Not on file  Stress: Not on file  Social Connections: Not on file  Intimate Partner Violence: Not on file    FAMILY HISTORY: Family History  Problem Relation Age of Onset   Breast cancer Mother 65   Breast cancer Cousin 41  pat cousin    ALLERGIES:  is allergic to sudafed [pseudoephedrine hcl] and latex.  MEDICATIONS:  Current Outpatient Medications  Medication Sig Dispense Refill   albuterol (VENTOLIN HFA) 108 (90 Base) MCG/ACT inhaler Inhale into the lungs.     budesonide-formoterol (SYMBICORT) 160-4.5 MCG/ACT inhaler INHALE 2 PUFFS TWO TIMES A DAY.     Calcium Carbonate-Vitamin D 600-200 MG-UNIT TABS Take by mouth.     Cetirizine HCl 10 MG CAPS Take by mouth.      Cholecalciferol 50 MCG (2000 UT) CAPS Take by mouth.     levothyroxine (SYNTHROID) 75 MCG tablet Take 75 mcg by mouth daily.     montelukast (SINGULAIR) 10 MG tablet TAKE 1 TABLET BY MOUTH EVERY DAY AT NIGHT     Spacer/Aero-Holding Chambers (AEROCHAMBER MV) inhaler 1 each by Miscellaneous route two (2) times a day as needed.     triamcinolone cream (KENALOG) 0.1 % SMARTSIG:Sparingly Topical Twice a Week     triamcinolone cream (KENALOG) 0.1 % Apply topically.     No current facility-administered medications for this visit.     PHYSICAL EXAMINATION: ECOG PERFORMANCE STATUS: 0 - Asymptomatic Vitals:   05/23/21 1511  BP: (!) 153/78  Pulse: 77  Resp: 19  Temp: 97.8 F (36.6 C)  SpO2: 99%   Filed Weights   05/23/21 1511  Weight: 258 lb 4.8 oz (117.2 kg)    Physical Exam Constitutional:      General: She is not in acute distress. HENT:     Head: Normocephalic and atraumatic.  Eyes:     General: No scleral icterus. Cardiovascular:     Rate and Rhythm: Normal rate and regular rhythm.     Heart sounds: Normal heart sounds.  Pulmonary:     Effort: Pulmonary effort is normal. No respiratory distress.     Breath sounds: No wheezing.  Abdominal:     General: Bowel sounds are normal. There is no distension.     Palpations: Abdomen is soft.  Musculoskeletal:        General: No deformity. Normal range of motion.     Cervical back: Normal range of motion and neck supple.  Skin:    General: Skin is warm and dry.     Findings: No erythema or rash.  Neurological:     Mental Status: She is alert and oriented to person, place, and time. Mental status is at baseline.     Cranial Nerves: No cranial nerve deficit.     Coordination: Coordination normal.  Psychiatric:        Mood and Affect: Mood normal.    LABORATORY DATA:  I have reviewed the data as listed Lab Results  Component Value Date   WBC 9.3 05/23/2021   HGB 13.9 05/23/2021   HCT 42.8 05/23/2021   MCV 92.8  05/23/2021   PLT 333 05/23/2021   Recent Labs    05/23/21 1607  NA 138  K 4.2  CL 103  CO2 26  GLUCOSE 100*  BUN 17  CREATININE 0.82  CALCIUM 9.4  GFRNONAA >60  PROT 8.4*  ALBUMIN 4.2  AST 14*  ALT 10  ALKPHOS 51  BILITOT 0.6   Iron/TIBC/Ferritin/ %Sat No results found for: IRON, TIBC, FERRITIN, IRONPCTSAT    RADIOGRAPHIC STUDIES: I have personally reviewed the radiological images as listed and agreed with the findings in the report. MM CLIP PLACEMENT LEFT  Result Date: 05/10/2021 CLINICAL DATA:  Status post stereotactic guided biopsy EXAM: 3D DIAGNOSTIC LEFT MAMMOGRAM POST STEREOTACTIC  BIOPSY COMPARISON:  Previous exam(s). FINDINGS: 3D Mammographic images were obtained following stereotactic guided biopsy of a LEFT breast mass. The COIL biopsy marking clip is in expected position at the site of biopsy; however the biopsied mass is obscured by a small post biopsy hematoma and as such, there may be a small degree of clip displacement. IMPRESSION: Appropriate approximate positioning of the COIL shaped biopsy marking clip at the site of biopsy in the LEFT upper inner breast. Final Assessment: Post Procedure Mammograms for Marker Placement Electronically Signed   By: Valentino Saxon M.D.   On: 05/10/2021 13:48  MM LT BREAST BX W LOC DEV 1ST LESION IMAGE BX SPEC STEREO GUIDE  Addendum Date: 05/11/2021   ADDENDUM REPORT: 05/11/2021 12:04 ADDENDUM: PATHOLOGY revealed: A. BREAST, LEFT, UPPER INNER; STEREOTACTIC-GUIDED CORE BIOPSY:- INVASIVE MAMMARY CARCINOMA, NO SPECIAL TYPE. 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 Size of invasive carcinoma: 2.5 mm in this sample. Histologic grade of invasive carcinoma: Grade 1. Ductal carcinoma in situ: Not identified. Lymphovascular invasion: Not identified. Pathology results are CONCORDANT with imaging findings, per Dr. Valentino Saxon. Pathology results and recommendations were discussed with patient via telephone on  05/11/2021. Patient reported doing well after the biopsy with no adverse symptoms, and only slight tenderness at the site. Post biopsy care instructions were reviewed, questions were answered and my direct phone number was provided. Patient was instructed to call [facility] for any additional questions or concerns related to biopsy site. RECOMMENDATION: Surgical consultation. Request for surgical consultation relayed to Al Pimple RN and Tanya Nones RN at [facility] by Electa Sniff RN on 05/11/2021. Pathology results reported by Electa Sniff RN on 05/11/2021. Electronically Signed   By: Valentino Saxon M.D.   On: 05/11/2021 12:04   Result Date: 05/11/2021 CLINICAL DATA:  Indeterminate LEFT breast mass, sonographically occult EXAM: LEFT BREAST STEREOTACTIC CORE NEEDLE BIOPSY COMPARISON:  Previous exams. FINDINGS: The patient and I discussed the procedure of stereotactic-guided biopsy including benefits and alternatives. We discussed the high likelihood of a successful procedure. We discussed the risks of the procedure including infection, bleeding, tissue injury, clip migration, and inadequate sampling. Informed written consent was given. The usual time out protocol was performed immediately prior to the procedure. Using sterile technique and 1% lidocaine and 1% lidocaine with epinephrine as local anesthetic, under stereotactic guidance, a 9 gauge vacuum assisted device was used to perform core needle biopsy of a mass in the LEFT upper inner breast using a medial oblique approach. Specimen radiograph was performed showing representative tissue. Lesion quadrant: Upper inner quadrant At the conclusion of the procedure, a COIL tissue marker clip was deployed into the biopsy cavity. Follow-up 2-view mammogram was performed and dictated separately. IMPRESSION: Stereotactic-guided biopsy of a LEFT breast. There is a small 1 cm hematoma at the site of biopsy. No significant complication.  Electronically Signed: By: Valentino Saxon M.D. On: 05/10/2021 13:42     ASSESSMENT & PLAN:  1. Invasive carcinoma of breast ([place])   2. Goals of care, counseling/discussion   3. Family history of cancer    Clinical stage I left invasive breast carcinoma, ER/PR positive, HER2 negative. Recommend lumpectomy with sentinel lymph node biopsy. Discussed with patient recommendation of postop radiation. Most likely she will not need adjuvant chemotherapy if tumor size is less than 5 mm. Discussed about adjuvant endocrine therapy. Family history of breast cancer.  Refer to Dietitian.  Orders Placed This Encounter  Procedures   Comprehensive metabolic panel  Standing Status:   Future    Number of Occurrences:   1    Standing Expiration Date:   05/23/2022   CBC with Differential/Platelet    Standing Status:   Future    Number of Occurrences:   1    Standing Expiration Date:   05/23/2022   Ambulatory referral to Genetics    Referral Priority:   Urgent    Referral Type:   Consultation    Referral Reason:   Specialty Services Required    Number of Visits Requested:   1    All questions were answered. The patient knows to call the clinic with any problems questions or concerns.  cc Bowen, Lauren, MD    Return of visit: 1 week after surgery. Thank you for this kind referral and the opportunity to participate in the care of this patient. A copy of today's note is routed to referring provider    Earlie Server, MD, PhD Hematology Oncology [place] at [facility]  05/23/2021

## 2021-05-24 ENCOUNTER — Telehealth: Payer: Self-pay | Admitting: General Surgery

## 2021-05-24 ENCOUNTER — Telehealth: Payer: Self-pay

## 2021-05-24 ENCOUNTER — Other Ambulatory Visit: Payer: Self-pay

## 2021-05-24 ENCOUNTER — Encounter: Payer: Self-pay | Admitting: Pulmonary Disease

## 2021-05-24 ENCOUNTER — Ambulatory Visit: Payer: BC Managed Care – PPO | Admitting: Pulmonary Disease

## 2021-05-24 VITALS — BP 118/60 | HR 77 | Temp 98.0°F | Ht 68.0 in | Wt 259.0 lb

## 2021-05-24 DIAGNOSIS — K219 Gastro-esophageal reflux disease without esophagitis: Secondary | ICD-10-CM | POA: Diagnosis not present

## 2021-05-24 DIAGNOSIS — J4540 Moderate persistent asthma, uncomplicated: Secondary | ICD-10-CM

## 2021-05-24 DIAGNOSIS — Z01811 Encounter for preprocedural respiratory examination: Secondary | ICD-10-CM

## 2021-05-24 MED ORDER — ESOMEPRAZOLE MAGNESIUM 40 MG PO CPDR: 40.0000 mg | DELAYED_RELEASE_CAPSULE | Freq: Every day | ORAL | 1 refills | Status: DC

## 2021-05-24 MED ORDER — ALBUTEROL SULFATE HFA 108 (90 BASE) MCG/ACT IN AERS: 2.0000 | INHALATION_SPRAY | Freq: Four times a day (QID) | RESPIRATORY_TRACT | 3 refills | Status: AC | PRN

## 2021-05-24 NOTE — Telephone Encounter (Signed)
Error

## 2021-05-24 NOTE — Telephone Encounter (Signed)
Patient calls and states that she needs a work note for her employer stating that she was here on 05/18/21 to see Dr. Celine Ahr.  Please call her when ready.  Thank you.

## 2021-05-24 NOTE — Telephone Encounter (Signed)
Patient has been advised of Pre-Admission date/time, COVID Testing date and Surgery date.  Surgery Date: 06/07/21 to arrive @ 8:30 am Preadmission Testing Date: 05/31/21 (phone 8a-1p) Covid Testing Date: Not needed.     Patient has been informed to arrive at 8:30 am the day of her surgery on 06/07/21 as she  will be having SLN bx done first.   Patient also reminded of her RF tag that is scheduled at the [facility] on 06/05/21.   Patient verbalized understanding with the above.

## 2021-05-24 NOTE — Patient Instructions (Signed)
Your asthma appears to be under good control.  You will need breathing test in the future however given the timing of your surgery we do not have time to schedule these.  We will continue Symbicort 160/4.5, 2 inhalations twice a day and as needed albuterol.  We sent the prescription of albuterol to your pharmacy.  We are giving you a prescription of Nexium to prevent issues with your gastroesophageal reflux as these can trigger an asthma exacerbation and want to control all of these issues prior to surgery.  We will see you in follow-up in 2 to 3 months time call sooner should any new problems arise.

## 2021-05-24 NOTE — Progress Notes (Signed)
Subjective:    Patient ID: Breanna [name], female    DOB: 11/20/1960, 60 y.o.   MRN: [id number] Chief Complaint  Patient presents with   Consult    Asthma- no concerns   HPI The patient is a 60 year old lifelong never smoker who has carried a diagnosis of asthma for 10 years.  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  She is kindly referred by Dr. Fredirick Maudlin.  Her oncologist is Dr. Earlie Server.  The patient has been maintained on Symbicort 160/4.52 puffs twice a day and Singulair 10 mg p.o. daily for management of her asthma.  She also uses Ventolin rescue inhaler.  She notes that she has been doing well with her asthma and uses albuterol usually on average of twice to 3 times a month.  Several days back she did have "sniffles" and had to use her albuterol twice a day for 2 days but then this resolved without issue.  She has not had any fevers, chills or sweats.  She notes that cold weather does exacerbate her symptoms and she attributes these "sniffles" to the change in weather.  She has not noticed any wheezing since that episode.  No cough or sputum production.  No hemoptysis.  Overall she feels that she is well controlled with her asthma.  She does not endorse any orthopnea, paroxysmal nocturnal dyspnea or lower extremity edema.  She does have issues with gastroesophageal reflux and lately did have a flare of this.  She does not take any medications regularly for this.  Overall she feels well and looks well.  She is employed as a Pharmacist, hospital for [employer].  She does not have a significant occupational exposure, no prior military history.  She has never had to be hospitalized for asthma exacerbation.  Does not recall when the last time she had to take prednisone was.  Her ACT score today is 22 indicating good control of her asthma.  Review of  Systems A 10 point review of systems was performed and it is as noted above otherwise negative.  Past Medical History:  Diagnosis Date   Asthma    Past Surgical History:  Procedure Laterality Date   BREAST BIOPSY Left 05/10/2021   Affirm bx- mass/"coil" marker-path pending   CESAREAN SECTION     HERNIA REPAIR     Family History  Problem Relation Age of Onset   Breast cancer Mother 42   Breast cancer Cousin 74       pat cousin   Social History   Tobacco Use   Smoking status: Never   Smokeless tobacco: Never  Substance Use Topics   Alcohol use: Never   Allergies  Allergen Reactions   Latex Itching   Sudafed [Pseudoephedrine Hcl] Shortness Of Breath   Current Meds  Medication Sig   albuterol (VENTOLIN HFA) 108 (90 Base) MCG/ACT inhaler Inhale into the lungs.   budesonide-formoterol (SYMBICORT) 160-4.5 MCG/ACT inhaler INHALE 2 PUFFS TWO TIMES A DAY.   Calcium Carbonate-Vitamin D 600-200 MG-UNIT TABS Take by mouth.   Cetirizine HCl 10 MG CAPS Take by mouth.   Cholecalciferol 50 MCG (2000 UT) CAPS Take by mouth.   levothyroxine (SYNTHROID) 75 MCG tablet Take 75 mcg by mouth daily.   montelukast (SINGULAIR) 10 MG tablet TAKE 1 TABLET BY MOUTH EVERY DAY AT NIGHT   Spacer/Aero-Holding Chambers (AEROCHAMBER MV) inhaler 1 each  by Miscellaneous route two (2) times a day as needed.   triamcinolone cream (KENALOG) 0.1 % SMARTSIG:Sparingly Topical Twice a Week   triamcinolone cream (KENALOG) 0.1 % Apply topically.   Immunization History  Administered Date(s) Administered   Influenza,inj,Quad PF,6+ Mos 06/25/2017, 09/04/2018, 05/28/2019   Influenza-Unspecified 06/25/2017, 07/19/2020   Moderna Sars-Covid-2 Vaccination 10/23/2019, 11/20/2019   Pneumococcal Polysaccharide-23 04/10/2013   Td 08/27/2009   Tdap 12/19/2009, 12/10/2019       Objective:   Physical Exam BP 118/60 (BP Location: Left Arm, Patient Position: Sitting, Cuff Size: Normal)   Pulse 77   Temp 98 F (36.7 C)  (Oral)   Ht _0  (1.727 m)   Wt 259 lb (117.5 kg)   SpO2 97%   BMI 39.38 kg/m  GENERAL: This is an obese woman, no acute distress, fully ambulatory.  No conversational dyspnea. HEAD: Normocephalic, atraumatic.  EYES: Pupils equal, round, reactive to light.  No scleral icterus.  MOUTH: Nose/mouth/throat not examined due to masking requirements for COVID 19. NECK: Supple. No thyromegaly. Trachea midline. No JVD.  No adenopathy. PULMONARY: Good air entry bilaterally.  No adventitious sounds. CARDIOVASCULAR: S1 and S2. Regular rate and rhythm.  No rubs, murmurs or gallops heard. ABDOMEN: Obese otherwise benign. MUSCULOSKELETAL: No joint deformity, no clubbing, no edema.  NEUROLOGIC: No focal deficit, no gait disturbance, speech is fluent. SKIN: Intact,warm,dry. PSYCH: Mood and behavior normal  Asthma Control Test ACT Total Score  05/24/2021 22        Assessment & Plan:     ICD-10-CM   1. Moderate persistent asthma without complication  W09.81    ACT score is 22 indicating good control of asthma Cannot get PFTs on short notice Patient clinically appears well controlled     2. Gastroesophageal reflux disease, unspecified whether esophagitis present  K21.9    We are empirically placing her on Nexium until after the surgery GERD can trigger asthma and want to prevent this     3. Pre-operative respiratory examination  Z01.811    ACT score is 22 indicating good control of her asthma Continue using current inhaler therapy From the pulmonary standpoint she is a mild risk     Meds ordered this encounter  Medications   albuterol (VENTOLIN HFA) 108 (90 Base) MCG/ACT inhaler    Sig: Inhale 2 puffs into the lungs every 6 (six) hours as needed for wheezing or shortness of breath.    Dispense:  8 g    Refill:  3   esomeprazole (NEXIUM) 40 MG capsule    Sig: Take 1 capsule (40 mg total) by mouth daily before supper.    Dispense:  30 capsule    Refill:  1   The patient appears to  be a mild risk for pulmonary complications for the proposed procedure.  ACT score is 22 indicating good control of her asthma.  She will need PFTs in the future to establish baseline however clinically she appears to be doing very well.  We will see her in follow-up in 2 to 3 months time she is to contact us prior to that time should any new difficulties arise.  Renold Don, MD Advanced Bronchoscopy PCCM [facility]    *This note was dictated using voice recognition software/Dragon.  Despite best efforts to proofread, errors can occur which can change the meaning.  Any change was purely unintentional.

## 2021-05-25 ENCOUNTER — Telehealth: Payer: Self-pay | Admitting: Licensed Clinical Social Worker

## 2021-05-25 ENCOUNTER — Inpatient Hospital Stay: Payer: BC Managed Care – PPO

## 2021-05-25 NOTE — Telephone Encounter (Signed)
Left message for Breanna [name] explaining that we could have her blood drawn today or tomorrow for genetics and she can see me later this afternoon/next week if she wants to try to have genetics back before her surgery on 10/12. Asked her to call back so we can discuss further/schedule her.

## 2021-05-26 ENCOUNTER — Encounter: Payer: Self-pay | Admitting: General Surgery

## 2021-05-29 ENCOUNTER — Telehealth: Payer: Self-pay

## 2021-05-29 MED ORDER — LIDOCAINE-PRILOCAINE 2.5-2.5 % EX CREA: TOPICAL_CREAM | CUTANEOUS | 0 refills | Status: DC

## 2021-05-29 NOTE — Progress Notes (Signed)
Pulmonary Clearance has been received from Dr Domingo Dimes office. The patient is optimized for surgery and cleared at Low risk.

## 2021-05-29 NOTE — Telephone Encounter (Signed)
EMLA creme has been sent in to pharmacy. Directions left on VM.

## 2021-05-30 ENCOUNTER — Ambulatory Visit: Payer: Self-pay | Admitting: Surgery

## 2021-05-30 DIAGNOSIS — C50212 Malignant neoplasm of upper-inner quadrant of left female breast: Secondary | ICD-10-CM

## 2021-05-30 DIAGNOSIS — Z170 Estrogen receptor positive status [ER+]: Secondary | ICD-10-CM

## 2021-05-31 ENCOUNTER — Other Ambulatory Visit: Payer: Self-pay

## 2021-05-31 ENCOUNTER — Encounter
Admission: RE | Admit: 2021-05-31 | Discharge: 2021-05-31 | Disposition: A | Discharge status: Home / Self Care | Payer: BC Managed Care – PPO | Source: Ambulatory Visit | Attending: Surgery | Admitting: Surgery

## 2021-05-31 HISTORY — DX: Hypothyroidism, unspecified: E03.9

## 2021-05-31 HISTORY — DX: Gastro-esophageal reflux disease without esophagitis: K21.9

## 2021-05-31 HISTORY — DX: Malignant (primary) neoplasm, unspecified: C80.1

## 2021-05-31 HISTORY — DX: Ventral hernia without obstruction or gangrene: K43.9

## 2021-05-31 HISTORY — DX: Malignant neoplasm of unspecified site of unspecified female breast: C50.919

## 2021-05-31 NOTE — Patient Instructions (Signed)
Your procedure is scheduled on:06-07-21 Wednesday Report to the Registration Desk on the 1st floor of the [facility].Then proceed to the Radiology desk (2nd desk on right) in the [facility]-Arrive at 8:30 AM  REMEMBER: Instructions that are not followed completely may result in serious medical risk, up to and including death; or upon the discretion of your surgeon and anesthesiologist your surgery may need to be rescheduled.  Do not eat food after midnight the night before surgery.  No gum chewing, lozengers or hard candies.  You may however, drink CLEAR liquids up to 2 hours before you are scheduled to arrive for your surgery. Do not drink anything within 2 hours of your scheduled arrival time.  Clear liquids include: - water  - apple juice without pulp - gatorade (not RED, PURPLE, OR BLUE) - black coffee or tea (Do NOT add milk or creamers to the coffee or tea) Do NOT drink anything that is not on this list  TAKE THESE MEDICATIONS THE MORNING OF SURGERY WITH A SIP OF WATER: -esomeprazole (NEXIUM) 40 MG capsule -levothyroxine (SYNTHROID) 75 MCG tablet  Use your budesonide-formoterol (SYMBICORT) 160-4.5 MCG/ACT inhaler and albuterol (VENTOLIN HFA) 108 (90 Base) MCG/ACT inhaler and bring albuterol (VENTOLIN HFA) 108 (90 Base) MCG/ACT inhaler to the hospital  One week prior to surgery: Stop Anti-inflammatories (NSAIDS) such as Advil, Aleve, Ibuprofen, Motrin, Naproxen, Naprosyn and Aspirin based products such as Excedrin, Goodys Powder, BC Powder.You may however, continue to take Tylenol if needed for pain up until the day of surgery.  Stop ANY OVER THE COUNTER supplements/vitamins NOW (05-31-21) until after surgery (Calcium Carbonate-Vitamin D 600-200 MG-UNIT TABS and Cholecalciferol 50 MCG (2000 UT) CAPS)  No Alcohol for 24 hours before or after surgery.  No Smoking including e-cigarettes for 24 hours prior to surgery.  No chewable tobacco products for at least 6 hours prior to  surgery.  No nicotine patches on the day of surgery.  Do not use any "recreational" drugs for at least a week prior to your surgery.  Please be advised that the combination of cocaine and anesthesia may have negative outcomes, up to and including death. If you test positive for cocaine, your surgery will be cancelled.  On the morning of surgery brush your teeth with toothpaste and water, you may rinse your mouth with mouthwash if you wish. Do not swallow any toothpaste or mouthwash.  Use CHG Soap as directed on instruction sheet.  Do not wear jewelry, make-up, hairpins, clips or nail polish.  Do not wear lotions, powders, or perfumes.   Do not shave body from the neck down 48 hours prior to surgery just in case you cut yourself which could leave a site for infection.  Also, freshly shaved skin may become irritated if using the CHG soap.  Contact lenses, hearing aids and dentures may not be worn into surgery.  Do not bring valuables to the hospital. [facility] is not responsible for any missing/lost belongings or valuables.   Notify your doctor if there is any change in your medical condition (cold, fever, infection).  Wear comfortable clothing (specific to your surgery type) to the hospital.  After surgery, you can help prevent lung complications by doing breathing exercises.  Take deep breaths and cough every 1-2 hours. Your doctor may order a device called an Incentive Spirometer to help you take deep breaths. When coughing or sneezing, hold a pillow firmly against your incision with both hands. This is called "splinting." Doing this helps protect your  incision. It also decreases belly discomfort.  If you are being admitted to the hospital overnight, leave your suitcase in the car. After surgery it may be brought to your room.  If you are being discharged the day of surgery, you will not be allowed to drive home. You will need a responsible adult (18 years or older) to drive you  home and stay with you that night.   If you are taking public transportation, you will need to have a responsible adult (18 years or older) with you. Please confirm with your physician that it is acceptable to use public transportation.   Please call the [facility] at [phone] if you have any questions about these instructions.  Surgery Visitation Policy:  Patients undergoing a surgery or procedure may have one family member or support person with them as long as that person is not COVID-19 positive or experiencing its symptoms.  That person may remain in the waiting area during the procedure and may rotate out with other people.  Inpatient Visitation:    Visiting hours are 7 a.m. to 8 p.m. Up to two visitors ages 16+ are allowed at one time in a patient room. The visitors may rotate out with other people during the day. Visitors must check out when they leave, or other visitors will not be allowed. One designated support person may remain overnight. The visitor must pass COVID-19 screenings, use hand sanitizer when entering and exiting the patient's room and wear a mask at all times, including in the patient's room. Patients must also wear a mask when staff or their visitor are in the room. Masking is required regardless of vaccination status.

## 2021-06-01 ENCOUNTER — Telehealth: Payer: Self-pay | Admitting: Surgery

## 2021-06-01 NOTE — Telephone Encounter (Signed)
Outgoing call is made and left message for patient to call me.  The OR has shifted her case to start earlier, therefore her time with nuc med for her SLN injection has been moved  up as well.  Patient now needs to arrive at 7:30 am on October 12th, 2022.

## 2021-06-05 ENCOUNTER — Ambulatory Visit
Admission: RE | Admit: 2021-06-05 | Discharge: 2021-06-05 | Disposition: A | Discharge status: Home / Self Care | Payer: BC Managed Care – PPO | Source: Ambulatory Visit | Attending: General Surgery | Admitting: General Surgery

## 2021-06-05 ENCOUNTER — Telehealth: Payer: Self-pay | Admitting: Licensed Clinical Social Worker

## 2021-06-05 ENCOUNTER — Other Ambulatory Visit: Payer: Self-pay

## 2021-06-05 DIAGNOSIS — C50919 Malignant neoplasm of unspecified site of unspecified female breast: Secondary | ICD-10-CM | POA: Diagnosis not present

## 2021-06-06 MED ORDER — CHLORHEXIDINE GLUCONATE CLOTH 2 % EX PADS: 6.0000 | MEDICATED_PAD | Freq: Once | CUTANEOUS | Status: DC

## 2021-06-06 MED ORDER — CHLORHEXIDINE GLUCONATE 0.12 % MT SOLN
15.0000 mL | Freq: Once | OROMUCOSAL | Status: AC
Start: 2021-06-06 — End: 2021-06-07

## 2021-06-06 MED ORDER — CELECOXIB 200 MG PO CAPS: 200.0000 mg | ORAL_CAPSULE | ORAL | Status: AC

## 2021-06-06 MED ORDER — BUPIVACAINE LIPOSOME 1.3 % IJ SUSP: 20.0000 mL | Freq: Once | INTRAMUSCULAR | Status: DC

## 2021-06-06 MED ORDER — GABAPENTIN 300 MG PO CAPS: 300.0000 mg | ORAL_CAPSULE | ORAL | Status: AC

## 2021-06-06 MED ORDER — ORAL CARE MOUTH RINSE: 15.0000 mL | Freq: Once | OROMUCOSAL | Status: AC

## 2021-06-06 MED ORDER — LACTATED RINGERS IV SOLN: INTRAVENOUS | Status: DC

## 2021-06-06 MED ORDER — CEFAZOLIN SODIUM-DEXTROSE 2-4 GM/100ML-% IV SOLN
2.0000 g | INTRAVENOUS | Status: AC
  Administered 2021-06-07: 2 g via INTRAVENOUS

## 2021-06-06 MED ORDER — ACETAMINOPHEN 500 MG PO TABS: 1000.0000 mg | ORAL_TABLET | ORAL | Status: AC

## 2021-06-06 NOTE — Telephone Encounter (Signed)
Revealed negative/normal genetic testing on the [facility] Breast Cancer STAT Panel. This panel includes 9 genes with the highest risk for breast cancer that some women use to help guide surgical decision. The remainder of her testing will be back soon and we will update her, we are also seeing Breanna [name] for a genetic counseling consult on 06/15/2021.

## 2021-06-07 ENCOUNTER — Encounter: Payer: BC Managed Care – PPO | Admitting: Hospice and Palliative Medicine

## 2021-06-07 ENCOUNTER — Encounter: Payer: Self-pay | Admitting: Surgery

## 2021-06-07 ENCOUNTER — Ambulatory Visit
Admission: RE | Admit: 2021-06-07 | Discharge: 2021-06-07 | Disposition: A | Discharge status: Home / Self Care | Payer: BC Managed Care – PPO | Source: Ambulatory Visit | Attending: General Surgery | Admitting: General Surgery

## 2021-06-07 ENCOUNTER — Encounter
Admission: RE | Admit: 2021-06-07 | Discharge: 2021-06-07 | Disposition: A | Discharge status: Home / Self Care | Payer: BC Managed Care – PPO | Source: Ambulatory Visit | Attending: General Surgery | Admitting: General Surgery

## 2021-06-07 ENCOUNTER — Ambulatory Visit
Admission: RE | Admit: 2021-06-07 | Discharge: 2021-06-07 | Disposition: A | Discharge status: Home / Self Care | Payer: BC Managed Care – PPO | Attending: Surgery | Admitting: Surgery

## 2021-06-07 ENCOUNTER — Other Ambulatory Visit: Payer: Self-pay

## 2021-06-07 ENCOUNTER — Ambulatory Visit: Payer: BC Managed Care – PPO | Admitting: Urgent Care

## 2021-06-07 ENCOUNTER — Encounter
Admission: RE | Disposition: A | Discharge status: Home / Self Care | Payer: Self-pay | Source: Home / Self Care | Attending: Surgery

## 2021-06-07 ENCOUNTER — Ambulatory Visit: Payer: BC Managed Care – PPO | Admitting: Certified Registered"

## 2021-06-07 DIAGNOSIS — Z170 Estrogen receptor positive status [ER+]: Secondary | ICD-10-CM | POA: Diagnosis not present

## 2021-06-07 DIAGNOSIS — C50912 Malignant neoplasm of unspecified site of left female breast: Secondary | ICD-10-CM

## 2021-06-07 DIAGNOSIS — Z7951 Long term (current) use of inhaled steroids: Secondary | ICD-10-CM | POA: Diagnosis not present

## 2021-06-07 DIAGNOSIS — Z91040 Latex allergy status: Secondary | ICD-10-CM | POA: Insufficient documentation

## 2021-06-07 DIAGNOSIS — N6092 Unspecified benign mammary dysplasia of left breast: Secondary | ICD-10-CM | POA: Insufficient documentation

## 2021-06-07 DIAGNOSIS — Z79899 Other long term (current) drug therapy: Secondary | ICD-10-CM | POA: Insufficient documentation

## 2021-06-07 DIAGNOSIS — C50919 Malignant neoplasm of unspecified site of unspecified female breast: Secondary | ICD-10-CM

## 2021-06-07 DIAGNOSIS — Z888 Allergy status to other drugs, medicaments and biological substances status: Secondary | ICD-10-CM | POA: Diagnosis not present

## 2021-06-07 DIAGNOSIS — C50212 Malignant neoplasm of upper-inner quadrant of left female breast: Secondary | ICD-10-CM

## 2021-06-07 DIAGNOSIS — Z79890 Hormone replacement therapy (postmenopausal): Secondary | ICD-10-CM | POA: Insufficient documentation

## 2021-06-07 DIAGNOSIS — Z803 Family history of malignant neoplasm of breast: Secondary | ICD-10-CM | POA: Diagnosis not present

## 2021-06-07 HISTORY — PX: BREAST LUMPECTOMY,RADIO FREQ LOCALIZER,AXILLARY SENTINEL LYMPH NODE BIOPSY: SHX6900

## 2021-06-07 HISTORY — PX: BREAST LUMPECTOMY: SHX2

## 2021-06-07 SURGERY — BREAST LUMPECTOMY,RADIO FREQ LOCALIZER,AXILLARY SENTINEL LYMPH NODE BIOPSY
Anesthesia: General | Laterality: Left

## 2021-06-07 MED ORDER — LIDOCAINE HCL (PF) 2 % IJ SOLN
INTRAMUSCULAR | Status: AC
  Filled 2021-06-07: qty 5

## 2021-06-07 MED ORDER — PROPOFOL 10 MG/ML IV BOLUS
INTRAVENOUS | Status: DC | PRN
Start: 2021-06-07 — End: 2021-06-07
  Administered 2021-06-07: 200 mg via INTRAVENOUS

## 2021-06-07 MED ORDER — CELECOXIB 200 MG PO CAPS
ORAL_CAPSULE | ORAL | Status: AC
  Administered 2021-06-07: 200 mg via ORAL
  Filled 2021-06-07: qty 1

## 2021-06-07 MED ORDER — HYDROCODONE-ACETAMINOPHEN 5-325 MG PO TABS: 1.0000 | ORAL_TABLET | Freq: Four times a day (QID) | ORAL | 0 refills | Status: DC | PRN

## 2021-06-07 MED ORDER — FENTANYL CITRATE (PF) 100 MCG/2ML IJ SOLN
INTRAMUSCULAR | Status: DC | PRN
  Administered 2021-06-07 (×2): 50 ug via INTRAVENOUS

## 2021-06-07 MED ORDER — SUCCINYLCHOLINE CHLORIDE 200 MG/10ML IV SOSY
PREFILLED_SYRINGE | INTRAVENOUS | Status: AC
  Filled 2021-06-07: qty 10

## 2021-06-07 MED ORDER — SUGAMMADEX SODIUM 200 MG/2ML IV SOLN
INTRAVENOUS | Status: DC | PRN
  Administered 2021-06-07: 200 mg via INTRAVENOUS

## 2021-06-07 MED ORDER — ONDANSETRON HCL 4 MG/2ML IJ SOLN
INTRAMUSCULAR | Status: DC | PRN
  Administered 2021-06-07: 4 mg via INTRAVENOUS

## 2021-06-07 MED ORDER — ISOSULFAN BLUE 1 % ~~LOC~~ SOLN
SUBCUTANEOUS | Status: AC
  Filled 2021-06-07: qty 5

## 2021-06-07 MED ORDER — PHENYLEPHRINE HCL (PRESSORS) 10 MG/ML IV SOLN
INTRAVENOUS | Status: DC | PRN
  Administered 2021-06-07 (×6): 100 ug via INTRAVENOUS

## 2021-06-07 MED ORDER — STERILE WATER FOR IRRIGATION IR SOLN
Status: DC | PRN
  Administered 2021-06-07: 200 mL

## 2021-06-07 MED ORDER — GABAPENTIN 300 MG PO CAPS
ORAL_CAPSULE | ORAL | Status: AC
  Administered 2021-06-07: 300 mg via ORAL
  Filled 2021-06-07: qty 1

## 2021-06-07 MED ORDER — ALBUTEROL SULFATE HFA 108 (90 BASE) MCG/ACT IN AERS
INHALATION_SPRAY | RESPIRATORY_TRACT | Status: DC | PRN
  Administered 2021-06-07 (×2): 3 via RESPIRATORY_TRACT

## 2021-06-07 MED ORDER — DEXAMETHASONE SODIUM PHOSPHATE 10 MG/ML IJ SOLN
INTRAMUSCULAR | Status: AC
  Filled 2021-06-07: qty 1

## 2021-06-07 MED ORDER — ISOSULFAN BLUE 1 % ~~LOC~~ SOLN
SUBCUTANEOUS | Status: DC | PRN
  Administered 2021-06-07: 4 mL via SUBCUTANEOUS

## 2021-06-07 MED ORDER — SODIUM CHLORIDE 0.9 % IV SOLN
INTRAVENOUS | Status: DC | PRN
  Administered 2021-06-07: 30 ug/min via INTRAVENOUS

## 2021-06-07 MED ORDER — GLYCOPYRROLATE 0.2 MG/ML IJ SOLN
INTRAMUSCULAR | Status: AC
  Filled 2021-06-07: qty 1

## 2021-06-07 MED ORDER — MIDAZOLAM HCL 2 MG/2ML IJ SOLN
INTRAMUSCULAR | Status: DC | PRN
  Administered 2021-06-07: 2 mg via INTRAVENOUS

## 2021-06-07 MED ORDER — CHLORHEXIDINE GLUCONATE 0.12 % MT SOLN
OROMUCOSAL | Status: AC
  Administered 2021-06-07: 15 mL via OROMUCOSAL
  Filled 2021-06-07: qty 15

## 2021-06-07 MED ORDER — TECHNETIUM TC 99M TILMANOCEPT KIT
1.0000 | PACK | Freq: Once | INTRAVENOUS | Status: AC | PRN
  Administered 2021-06-07: 1.0104 via INTRADERMAL

## 2021-06-07 MED ORDER — SEVOFLURANE IN SOLN
RESPIRATORY_TRACT | Status: AC
  Filled 2021-06-07: qty 250

## 2021-06-07 MED ORDER — LIDOCAINE HCL (CARDIAC) PF 100 MG/5ML IV SOSY
PREFILLED_SYRINGE | INTRAVENOUS | Status: DC | PRN
  Administered 2021-06-07: 80 mg via INTRAVENOUS

## 2021-06-07 MED ORDER — CEFAZOLIN SODIUM-DEXTROSE 2-4 GM/100ML-% IV SOLN
INTRAVENOUS | Status: AC
  Filled 2021-06-07: qty 100

## 2021-06-07 MED ORDER — ROCURONIUM BROMIDE 100 MG/10ML IV SOLN
INTRAVENOUS | Status: DC | PRN
  Administered 2021-06-07 (×2): 30 mg via INTRAVENOUS
  Administered 2021-06-07: 20 mg via INTRAVENOUS

## 2021-06-07 MED ORDER — DEXAMETHASONE SODIUM PHOSPHATE 10 MG/ML IJ SOLN
INTRAMUSCULAR | Status: DC | PRN
  Administered 2021-06-07: 10 mg via INTRAVENOUS

## 2021-06-07 MED ORDER — ACETAMINOPHEN 500 MG PO TABS
ORAL_TABLET | ORAL | Status: AC
  Administered 2021-06-07: 1000 mg via ORAL
  Filled 2021-06-07: qty 2

## 2021-06-07 MED ORDER — ALBUTEROL SULFATE HFA 108 (90 BASE) MCG/ACT IN AERS
INHALATION_SPRAY | RESPIRATORY_TRACT | Status: AC
  Filled 2021-06-07: qty 6.7

## 2021-06-07 MED ORDER — ONDANSETRON HCL 4 MG/2ML IJ SOLN: 4.0000 mg | Freq: Once | INTRAMUSCULAR | Status: DC | PRN

## 2021-06-07 MED ORDER — ROCURONIUM BROMIDE 10 MG/ML (PF) SYRINGE
PREFILLED_SYRINGE | INTRAVENOUS | Status: AC
  Filled 2021-06-07: qty 10

## 2021-06-07 MED ORDER — GLYCOPYRROLATE 0.2 MG/ML IJ SOLN
INTRAMUSCULAR | Status: DC | PRN
  Administered 2021-06-07: .2 mg via INTRAVENOUS

## 2021-06-07 MED ORDER — PROPOFOL 10 MG/ML IV BOLUS
INTRAVENOUS | Status: AC
  Filled 2021-06-07: qty 20

## 2021-06-07 MED ORDER — ONDANSETRON HCL 4 MG/2ML IJ SOLN
INTRAMUSCULAR | Status: AC
  Filled 2021-06-07: qty 2

## 2021-06-07 MED ORDER — MIDAZOLAM HCL 2 MG/2ML IJ SOLN
INTRAMUSCULAR | Status: AC
  Filled 2021-06-07: qty 2

## 2021-06-07 MED ORDER — FENTANYL CITRATE (PF) 100 MCG/2ML IJ SOLN
INTRAMUSCULAR | Status: AC
  Filled 2021-06-07: qty 2

## 2021-06-07 MED ORDER — IBUPROFEN 800 MG PO TABS: 800.0000 mg | ORAL_TABLET | Freq: Three times a day (TID) | ORAL | 0 refills | Status: DC | PRN

## 2021-06-07 MED ORDER — LACTATED RINGERS IV SOLN: INTRAVENOUS | Status: DC | PRN

## 2021-06-07 MED ORDER — FENTANYL CITRATE (PF) 100 MCG/2ML IJ SOLN
25.0000 ug | INTRAMUSCULAR | Status: DC | PRN
  Administered 2021-06-07 (×2): 25 ug via INTRAVENOUS

## 2021-06-07 MED ORDER — MEPERIDINE HCL 25 MG/ML IJ SOLN: 6.2500 mg | INTRAMUSCULAR | Status: DC | PRN

## 2021-06-07 MED ORDER — BUPIVACAINE-EPINEPHRINE (PF) 0.25% -1:200000 IJ SOLN
INTRAMUSCULAR | Status: AC
  Filled 2021-06-07: qty 30

## 2021-06-07 SURGICAL SUPPLY — 43 items
ADH SKN CLS APL DERMABOND .7 (GAUZE/BANDAGES/DRESSINGS) ×1
APL PRP STRL LF DISP 70% ISPRP (MISCELLANEOUS) ×1
APPLIER CLIP 9.375 SM OPEN (CLIP)
APR CLP SM 9.3 20 MLT OPN (CLIP)
BLADE SURG 15 STRL LF DISP TIS (BLADE) ×1 IMPLANT
BLADE SURG 15 STRL SS (BLADE) ×2
CHLORAPREP W/TINT 26 (MISCELLANEOUS) ×2 IMPLANT
CLIP APPLIE 9.375 SM OPEN (CLIP) IMPLANT
CNTNR SPEC 2.5X3XGRAD LEK (MISCELLANEOUS)
CONT SPEC 4OZ STER OR WHT (MISCELLANEOUS)
CONT SPEC 4OZ STRL OR WHT (MISCELLANEOUS)
CONTAINER SPEC 2.5X3XGRAD LEK (MISCELLANEOUS) IMPLANT
COVER SURGICAL LIGHT HANDLE (MISCELLANEOUS) ×2 IMPLANT
DECANTER SPIKE VIAL GLASS SM (MISCELLANEOUS) ×2 IMPLANT
DERMABOND ADVANCED (GAUZE/BANDAGES/DRESSINGS) ×1
DERMABOND ADVANCED .7 DNX12 (GAUZE/BANDAGES/DRESSINGS) ×1 IMPLANT
DEVICE DUBIN SPECIMEN MAMMOGRA (MISCELLANEOUS) ×4 IMPLANT
DRAPE LAPAROTOMY TRNSV 106X77 (MISCELLANEOUS) ×2 IMPLANT
ELECT CAUTERY BLADE TIP 2.5 (TIP) ×2
ELECT REM PT RETURN 9FT ADLT (ELECTROSURGICAL) ×2
ELECTRODE CAUTERY BLDE TIP 2.5 (TIP) ×1 IMPLANT
ELECTRODE REM PT RTRN 9FT ADLT (ELECTROSURGICAL) ×1 IMPLANT
GAUZE 4X4 16PLY ~~LOC~~+RFID DBL (SPONGE) ×2 IMPLANT
GLOVE SURG ORTHO LTX SZ7.5 (GLOVE) ×6 IMPLANT
GOWN STRL REUS W/ TWL LRG LVL3 (GOWN DISPOSABLE) ×2 IMPLANT
GOWN STRL REUS W/TWL LRG LVL3 (GOWN DISPOSABLE) ×4
KIT MARKER MARGIN INK (KITS) ×2 IMPLANT
KIT TURNOVER KIT A (KITS) ×2 IMPLANT
MANIFOLD NEPTUNE II (INSTRUMENTS) ×2 IMPLANT
NEEDLE HYPO 22GX1.5 SAFETY (NEEDLE) ×2 IMPLANT
PACK BASIN MINOR ARMC (MISCELLANEOUS) ×2 IMPLANT
SET LOCALIZER 20 PROBE US (MISCELLANEOUS) ×2 IMPLANT
SLEVE PROBE SENORX GAMMA FIND (MISCELLANEOUS) ×2 IMPLANT
SUT MNCRL 4-0 (SUTURE) ×2
SUT MNCRL 4-0 27XMFL (SUTURE) ×1
SUT VIC AB 3-0 SH 27 (SUTURE) ×2
SUT VIC AB 3-0 SH 27X BRD (SUTURE) ×1 IMPLANT
SUTURE MNCRL 4-0 27XMF (SUTURE) ×1 IMPLANT
SYR 10ML LL (SYRINGE) ×2 IMPLANT
SYR 20ML LL LF (SYRINGE) ×2 IMPLANT
TRAP NEPTUNE SPECIMEN COLLECT (MISCELLANEOUS) ×2 IMPLANT
WATER STERILE IRR 1000ML POUR (IV SOLUTION) ×2 IMPLANT
WATER STERILE IRR 500ML POUR (IV SOLUTION) ×2 IMPLANT

## 2021-06-07 NOTE — Discharge Instructions (Signed)
AMBULATORY SURGERY  ?DISCHARGE INSTRUCTIONS ? ? ?The drugs that you were given will stay in your system until tomorrow so for the next 24 hours you should not: ? ?Drive an automobile ?Make any legal decisions ?Drink any alcoholic beverage ? ? ?You may resume regular meals tomorrow.  Today it is better to start with liquids and gradually work up to solid foods. ? ?You may eat anything you prefer, but it is better to start with liquids, then soup and crackers, and gradually work up to solid foods. ? ? ?Please notify your doctor immediately if you have any unusual bleeding, trouble breathing, redness and pain at the surgery site, drainage, fever, or pain not relieved by medication. ? ? ? ?Additional Instructions: ? ? ? ?Please contact your physician with any problems or Same Day Surgery at [phone], Monday through Friday 6 am to 4 pm, or [place] [facility] [place] Main number at [phone].  ?

## 2021-06-07 NOTE — Anesthesia Postprocedure Evaluation (Signed)
Anesthesia Post Note  Patient: Breanna [name]  Procedure(s) Performed: BREAST LUMPECTOMY,RADIO FREQ LOCALIZER,AXILLARY SENTINEL LYMPH NODE BIOPSY (Left)  Patient location during evaluation: PACU Anesthesia Type: General Level of consciousness: awake and alert, awake and oriented Pain management: pain level controlled Vital Signs Assessment: post-procedure vital signs reviewed and stable Respiratory status: spontaneous breathing, nonlabored ventilation and respiratory function stable Cardiovascular status: blood pressure returned to baseline and stable Postop Assessment: no apparent nausea or vomiting Anesthetic complications: no   No notable events documented.   Last Vitals:  Vitals:   06/07/21 1318 06/07/21 1327  BP:  (!) 148/86  Pulse: 72 76  Resp: 12 16  Temp: (!) 36.1 C (!) 35.8 C  SpO2: 94% 97%    Last Pain:  Vitals:   06/07/21 1327  TempSrc: Tympanic  PainSc:                  Phill Mutter

## 2021-06-07 NOTE — Op Note (Signed)
Pre-operative Diagnosis: Breast Cancer left breast, upper medial quadrant.    Post-operative Diagnosis: Same  Surgeon: Ronny Bacon, M.D., [facility]  Anesthesia: General endotracheal  Procedure: Left breast lumpectomy, RFID tag directed, left axillary sentinel node biopsy  Procedure Details  The patient was seen again in the Holding Room. The benefits, complications, treatment options, and expected outcomes were discussed with the patient. The risks of bleeding, infection, recurrence of symptoms, failure to resolve symptoms, hematoma, seroma, open wound, cosmetic deformity, and the need for further surgery were discussed.  The patient was taken to Operating Room, identified as Breanna [name] and the procedure verified.  A Time Out was held and the above information confirmed.  Prior to the induction of general anesthesia, antibiotic prophylaxis was administered. VTE prophylaxis was in place. The patient was positioned in the supine position. Appropriate anesthesia was then administered and tolerated well. The LOCALizer is used to mark the skin for incision.  A visual dye Isosulfan Blue 4 ml was injected periareolar dermis early under aseptic conditions.  Massage was administered to this area for 5 minutes prior to securely taping it.  The chest was prepped with Chloraprep and draped in the sterile fashion.   Attention was turned to the RFID tag localization site where an incision was made. Dissection using the LOCALizer to perform a lumpectomy with adequate margins was performed. This was done with electrocautery and sharp dissection with Mayo scissors.  The initial specimen excised and determined the RF ID tag was still deep to the first lumpectomy.  I then proceeded with excision of a sufficient portion of the deeper breast tissue, this confirmed I had remove the RF ID tag successfully. There was minimal bleeding, and the cavity packed.  The RF ID specimen was taken to the back table and painted  to demarcate the 6 surfaces of potential margin.  Faxitron was utilized and did not show the original biopsy marker, so Faxitron imaging was then performed on the first more superficial lumpectomy, and it was noted there.  That specimen was then labeled additional anterior margin.  The margins were not painted on that specimen.  I returned to the cavity to remove the packing, and hemostasis was confirmed with electrocautery.    Then using the hand-held probe an area of high counts was identified in the axilla, an incision was made and direction by the probe aided in dissection of a lymph node which was sent for permanent section.  The first node had remarkably high counts, I thought it would be the only node, however there was an additional node found that was more than double the 10% background count, and I felt it prudent to pursue it.  Therefore it was labeled sentinel node #2.  No other blue lymph nodes were identified, no other pathologically enlarged lymph nodes identified.  Once assuring that hemostasis was adequate and checked multiple times the wounds were closed with interrupted 3-0 Vicryl followed by 4-0 subcuticular Monocryl sutures.  Dermabond is utilized to seal the incision.    Findings: Faxitron imaging: Painted deep specimen contains the RF ID tag.  The specimen labeled additional anterior margin is the larger of the 2 and the margins were not painted, it contains the original core biopsy clip.  Estimated Blood Loss: Minimal         Drains: None         Specimens: 2 separate specimens from the upper medial quadrant of the left breast.  2 sentinel lymph nodes.  Complications: None.         Condition: Stable  Sentinel Node Biopsy Synoptic Operative Report  Operation performed with curative intent:Yes  Tracer(s) used to identify sentinel nodes in the upfront surgery (non-neoadjuvant) setting (select all that apply):Dye and Radioactive Tracer  Tracer(s) used to identify  sentinel nodes in the neoadjuvant setting (select all that apply):N/A  All nodes (colored or non-colored) present at the end of a dye-filled lymphatic channel were removed:Yes   All significantly radioactive nodes were removed:Yes  All palpable suspicious nodes were removed:N/A  Biopsy-proven positive nodes marked with clips prior to chemotherapy were identified and removed:N/A    Ronny Bacon, M.D., [facility]  06/07/2021 ; 12:42 PM

## 2021-06-07 NOTE — Anesthesia Procedure Notes (Signed)
Procedure Name: LMA Insertion Date/Time: 06/07/2021 10:33 AM Performed by: Jerrye Noble, CRNA Pre-anesthesia Checklist: Patient identified, Emergency Drugs available, Suction available and Patient being monitored Patient Re-evaluated:Patient Re-evaluated prior to induction Oxygen Delivery Method: Circle system utilized Preoxygenation: Pre-oxygenation with 100% oxygen Induction Type: IV induction Ventilation: Mask ventilation without difficulty LMA: LMA inserted LMA Size: 4.5 Number of attempts: 1 Placement Confirmation: positive ETCO2 and breath sounds checked- equal and bilateral Tube secured with: Tape

## 2021-06-07 NOTE — Transfer of Care (Signed)
Immediate Anesthesia Transfer of Care Note  Patient: Breanna [name]  Procedure(s) Performed: BREAST [place] SENTINEL LYMPH NODE BIOPSY (Left)  Patient Location: PACU  Anesthesia Type:General  Level of Consciousness: drowsy and patient cooperative  Airway & Oxygen Therapy: Patient Spontanous Breathing and Patient connected to face mask oxygen  Post-op Assessment: Report given to RN and Post -op Vital signs reviewed and stable  Post vital signs: Reviewed and stable  Last Vitals:  Vitals Value Taken Time  BP 131/78 06/07/21 1236  Temp    Pulse 87 06/07/21 1239  Resp 17 06/07/21 1239  SpO2 100 % 06/07/21 1239  Vitals shown include unvalidated device data.  Last Pain:  Vitals:   06/07/21 1000  TempSrc: Oral  PainSc: 0-No pain         Complications: No notable events documented.

## 2021-06-07 NOTE — Anesthesia Preprocedure Evaluation (Signed)
Anesthesia Evaluation  Patient identified by MRN, date of birth, ID band Patient awake    Reviewed: Allergy & Precautions, NPO status , Patient's Chart, lab work & pertinent test results  Airway Mallampati: II  TM Distance: >3 FB Neck ROM: Full    Dental no notable dental hx.    Pulmonary asthma (Symbicort Albuterol) ,    Pulmonary exam normal        Cardiovascular negative cardio ROS Normal cardiovascular exam     Neuro/Psych negative neurological ROS  negative psych ROS   GI/Hepatic Neg liver ROS, GERD  Medicated,  Endo/Other  Hypothyroidism Morbid obesity  Renal/GU negative Renal ROS  negative genitourinary   Musculoskeletal negative musculoskeletal ROS (+)   Abdominal   Peds negative pediatric ROS (+)  Hematology negative hematology ROS (+)   Anesthesia Other Findings Asthma    Breast cancer (HCC)  left  Cancer (HCC)    GERD (gastroesophageal reflux disease)  Hypothyroidism    Ventral hernia       Reproductive/Obstetrics negative OB ROS                             Anesthesia Physical Anesthesia Plan  ASA: 3  Anesthesia Plan: General   Post-op Pain Management:    Induction: Intravenous  PONV Risk Score and Plan: 2 and Propofol infusion, Ondansetron and Midazolam  Airway Management Planned: LMA and Oral ETT  Additional Equipment:   Intra-op Plan:   Post-operative Plan: Extubation in OR  Informed Consent:   Plan Discussed with: CRNA, Surgeon and Anesthesiologist  Anesthesia Plan Comments:         Anesthesia Quick Evaluation

## 2021-06-07 NOTE — Anesthesia Procedure Notes (Signed)
Procedure Name: Intubation Date/Time: 06/07/2021 10:50 AM Performed by: Jerrye Noble, CRNA Pre-anesthesia Checklist: Patient identified, Emergency Drugs available, Suction available and Patient being monitored Patient Re-evaluated:Patient Re-evaluated prior to induction Oxygen Delivery Method: Circle system utilized Preoxygenation: Pre-oxygenation with 100% oxygen Induction Type: IV induction Ventilation: Mask ventilation without difficulty Laryngoscope Size: McGraph and 3 Grade View: Grade I Tube type: Oral Tube size: 7.0 mm Number of attempts: 1 Airway Equipment and Method: Stylet, Oral airway and Video-laryngoscopy Placement Confirmation: ETT inserted through vocal cords under direct vision, positive ETCO2 and breath sounds checked- equal and bilateral Secured at: 22 cm Tube secured with: Tape Dental Injury: Teeth and Oropharynx as per pre-operative assessment

## 2021-06-07 NOTE — Interval H&P Note (Signed)
History and Physical Interval Note:  06/07/2021 10:00 AM  Breanna [name]  has presented today for surgery, with the diagnosis of left breast cancer.  The various methods of treatment have been discussed with the patient and family. After consideration of risks, benefits and other options for treatment, the patient has consented to  Procedure(s): [place] (Left) as a surgical intervention.  The patient's history has been reviewed, patient examined, no change in status, stable for surgery.  I have reviewed the patient's chart and labs.  Questions were answered to the patient's satisfaction.   Left side is marked.   Ronny Bacon

## 2021-06-08 ENCOUNTER — Encounter: Payer: Self-pay | Admitting: Surgery

## 2021-06-08 ENCOUNTER — Telehealth: Payer: Self-pay | Admitting: *Deleted

## 2021-06-08 NOTE — Telephone Encounter (Signed)
Patient would like to get a note mailed to her staying that she had surgery and will be out of work until her doctor releases her back to work.  She had surgery 06/07/21 by Dr Christian Mate Breast lumpectomy

## 2021-06-09 ENCOUNTER — Other Ambulatory Visit: Payer: Self-pay | Admitting: Anatomic Pathology & Clinical Pathology

## 2021-06-09 LAB — SURGICAL PATHOLOGY

## 2021-06-14 ENCOUNTER — Inpatient Hospital Stay: Payer: BC Managed Care – PPO | Attending: Hospice and Palliative Medicine | Admitting: Hospice and Palliative Medicine

## 2021-06-14 ENCOUNTER — Other Ambulatory Visit: Payer: Self-pay

## 2021-06-14 DIAGNOSIS — E039 Hypothyroidism, unspecified: Secondary | ICD-10-CM | POA: Insufficient documentation

## 2021-06-14 DIAGNOSIS — Z170 Estrogen receptor positive status [ER+]: Secondary | ICD-10-CM | POA: Insufficient documentation

## 2021-06-14 DIAGNOSIS — Z803 Family history of malignant neoplasm of breast: Secondary | ICD-10-CM | POA: Insufficient documentation

## 2021-06-14 DIAGNOSIS — C50212 Malignant neoplasm of upper-inner quadrant of left female breast: Secondary | ICD-10-CM | POA: Insufficient documentation

## 2021-06-14 DIAGNOSIS — C50919 Malignant neoplasm of unspecified site of unspecified female breast: Secondary | ICD-10-CM

## 2021-06-14 NOTE — Progress Notes (Signed)
Multidisciplinary Oncology Council Documentation  Breanna [name] was presented by our [facility] on 06/14/2021, which included representatives from:  Palliative Care Dietitian  Physical/Occupational Therapist Nurse Navigator Genetics Speech Therapist Social work Survivorship RN Financial Navigator Research RN   Breanna [name] currently presents with history of breast cancer  We reviewed previous medical and familial history, history of present illness, and recent lab results along with all available histopathologic and imaging studies. The [place] considered available treatment options and made the following recommendations/referrals:  Rehab screening  The MOC is a meeting of clinicians from various specialty areas who evaluate and discuss patients for whom a multidisciplinary approach is being considered. Final determinations in the plan of care are those of the provider(s).   Today's extended care, comprehensive team conference, Breanna [name] was not present for the discussion and was not examined.

## 2021-06-15 ENCOUNTER — Encounter: Payer: Self-pay | Admitting: Surgery

## 2021-06-15 ENCOUNTER — Inpatient Hospital Stay: Payer: BC Managed Care – PPO | Admitting: Licensed Clinical Social Worker

## 2021-06-15 ENCOUNTER — Inpatient Hospital Stay (HOSPITAL_BASED_OUTPATIENT_CLINIC_OR_DEPARTMENT_OTHER): Payer: BC Managed Care – PPO | Admitting: Oncology

## 2021-06-15 ENCOUNTER — Encounter: Payer: Self-pay | Admitting: Oncology

## 2021-06-15 ENCOUNTER — Ambulatory Visit (INDEPENDENT_AMBULATORY_CARE_PROVIDER_SITE_OTHER): Payer: BC Managed Care – PPO | Admitting: Surgery

## 2021-06-15 VITALS — BP 137/73 | HR 73 | Temp 98.1°F | Resp 16 | Wt 256.0 lb

## 2021-06-15 DIAGNOSIS — C50919 Malignant neoplasm of unspecified site of unspecified female breast: Secondary | ICD-10-CM | POA: Diagnosis not present

## 2021-06-15 DIAGNOSIS — Z809 Family history of malignant neoplasm, unspecified: Secondary | ICD-10-CM

## 2021-06-15 DIAGNOSIS — E039 Hypothyroidism, unspecified: Secondary | ICD-10-CM | POA: Diagnosis not present

## 2021-06-15 DIAGNOSIS — Z170 Estrogen receptor positive status [ER+]: Secondary | ICD-10-CM

## 2021-06-15 DIAGNOSIS — Z803 Family history of malignant neoplasm of breast: Secondary | ICD-10-CM | POA: Diagnosis not present

## 2021-06-15 DIAGNOSIS — Z09 Encounter for follow-up examination after completed treatment for conditions other than malignant neoplasm: Secondary | ICD-10-CM

## 2021-06-15 DIAGNOSIS — C50212 Malignant neoplasm of upper-inner quadrant of left female breast: Secondary | ICD-10-CM | POA: Diagnosis not present

## 2021-06-15 DIAGNOSIS — Z1379 Encounter for other screening for genetic and chromosomal anomalies: Secondary | ICD-10-CM | POA: Insufficient documentation

## 2021-06-15 NOTE — Progress Notes (Signed)
Pt in for follow up, reports soreness in left breast and under arm.

## 2021-06-15 NOTE — Progress Notes (Signed)
REFERRING PROVIDER: Earlie Server, MD [place],  [place] [address]  PRIMARY PROVIDER:  Verdie Shire, MD  PRIMARY REASON FOR VISIT:  1. Invasive carcinoma of breast ([place])   2. Genetic testing   3. Family history of cancer      HISTORY OF PRESENT ILLNESS:   Breanna [name], a 60 y.o. female, was seen for a [place] cancer genetics consultation at the request of Dr. Tasia Catchings due to a personal and family history of cancer.  [name] presents to clinic today to discuss the possibility of a hereditary predisposition to cancer, genetic testing, and to further clarify her future cancer risks, as well as potential cancer risks for family members.   In 2022, at the age of 62, [name] was diagnosed with invasive mammary carcinoma of the left breast, ER/PR+ Her2-. The treatment plan includes lumpectomy (completed 06/07/21) adjuvant radiation and adjuvant endocrine therapy.   CANCER HISTORY:  Oncology History   No history exists.     RISK FACTORS:  Menarche was at age 60-11.  First live birth at age 49.  Menopausal status: postmenopausal.   Past Medical History:  Diagnosis Date   Asthma    Breast cancer ([place])    left   Cancer ([facility])    GERD (gastroesophageal reflux disease)    Hypothyroidism    Ventral hernia     Past Surgical History:  Procedure Laterality Date   BREAST BIOPSY Left 05/10/2021   Affirm bx- mass/"coil" marker-path pending   BREAST [place] LYMPH NODE BIOPSY Left 06/07/2021   Procedure: [place] LYMPH NODE BIOPSY;  Surgeon: Ronny Bacon, MD;  Location: [facility] ORS;  Service: General;  Laterality: Left;   CESAREAN SECTION     COLONOSCOPY     ENDOMETRIAL BIOPSY     HERNIA REPAIR      Social History   Socioeconomic History   Marital status: Married    Spouse name: Not on file   Number of children: Not on file   Years of education: Not on file   Highest education  level: Not on file  Occupational History   Not on file  Tobacco Use   Smoking status: Never   Smokeless tobacco: Never  Vaping Use   Vaping Use: Never used  Substance and Sexual Activity   Alcohol use: Never   Drug use: Never   Sexual activity: Not on file  Other Topics Concern   Not on file  Social History Narrative   Not on file   Social Determinants of Health   Financial Resource Strain: Not on file  Food Insecurity: Not on file  Transportation Needs: Not on file  Physical Activity: Not on file  Stress: Not on file  Social Connections: Not on file     FAMILY HISTORY:  We obtained a detailed, 4-generation family history.  Significant diagnoses are listed below: Family History  Problem Relation Age of Onset   Breast cancer Mother 32   Breast cancer Cousin 59       pat cousin   [name] has 1 daughter, 37. She has 1 sister, 59, no history of cancer.   [name] mother had breast cancer at 64 and ovarian cancer in her 37s. She passed at 55. Patient had 3 maternal aunts, 1 uncle, no cancers. No cancers for maternal grandparents.  [name] father had prostate cancer in his 60s and died at 41. Patient had 10 paternal aunts/uncles. One uncle was recently  diagnosed with lung cancer, history of smoking. Another aunt had lung cancer, history of smoking. A paternal cousin had breast cancer at 5. Paternal grnadmother died at 4 and grandfather had prostate cancer and died at 51.  [name] is unaware of previous family history of genetic testing for hereditary cancer risks. Patient's maternal ancestors are of African American/Native American descent, and paternal ancestors are of African Teaching laboratory technician American descent. There is no reported Ashkenazi Jewish ancestry. There is no known consanguinity.    GENETIC COUNSELING ASSESSMENT: [name] is a 60 y.o. female with a personal and family history of breast cancer which is somewhat suggestive of a hereditary cancer syndrome  and predisposition to cancer. We, therefore, discussed and recommended the following at today's visit.   DISCUSSION: We discussed that approximately 10% of cancer is hereditary. Most cases of hereditary breast cancer are associated with BRCA1/BRCA2 genes, although there are other genes associated with hereditary breast cancer/ other cancers as well. Cancers and risks are gene specific.  We discussed that testing is beneficial for several reasons including surgical decision-making for breast cancer, knowing about other cancer risks, identifying potential screening and risk-reduction options that may be appropriate, and to understand if other family members could be at risk for cancer and allow them to undergo genetic testing.   We reviewed the characteristics, features and inheritance patterns of hereditary cancer syndromes. We also discussed genetic testing, including the appropriate family members to test, the process of testing, insurance coverage and turn-around-time for results. We discussed the implications of a negative, positive and/or variant of uncertain significant result. We recommended Ms. Moffa pursue genetic testing for the [facility] Breast Cancer STAT Panel + Multi-Cancer+RNA gene panel, which was drawn a few weeks ago to have results back in time for her surgery.  GENETIC TEST RESULTS:  The [facility] Breast Cancer STAT Panel+ Multi-Cancer Panel found no pathogenic mutations. The Multi-Cancer Panel + RNA offered by [facility] includes sequencing and/or deletion duplication testing of the following 84 genes: AIP, ALK, APC, ATM, AXIN2,BAP1,  BARD1, BLM, BMPR1A, BRCA1, BRCA2, BRIP1, CASR, CDC73, CDH1, CDK4, CDKN1B, CDKN1C, CDKN2A (p14ARF), CDKN2A (p16INK4a), CEBPA, CHEK2, CTNNA1, DICER1, DIS3L2, EGFR (c.2369C>T, p.Thr790Met variant only), EPCAM (Deletion/duplication testing only), FH, FLCN, GATA2, GPC3, GREM1 (Promoter region deletion/duplication testing only), HOXB13 (c.251G>A, p.Gly84Glu), HRAS, KIT,  MAX, MEN1, MET, MITF (c.952G>A, p.Glu318Lys variant only), MLH1, MSH2, MSH3, MSH6, MUTYH, NBN, NF1, NF2, NTHL1, PALB2, PDGFRA, PHOX2B, PMS2, POLD1, POLE, POT1, PRKAR1A, PTCH1, PTEN, RAD50, RAD51C, RAD51D, RB1, RECQL4, RET, RUNX1, SDHAF2, SDHA (sequence changes only), SDHB, SDHC, SDHD, SMAD4, SMARCA4, SMARCB1, SMARCE1, STK11, SUFU, TERC, TERT, TMEM127, TP53, TSC1, TSC2, VHL, WRN and WT1.   The test report has been scanned into EPIC and is located under the Molecular Pathology section of the Results Review tab.  A portion of the result report is included below for reference. Genetic testing reported out on 06/13/2021.     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  At this time, it is unknown if this variant is associated with an increased risk for cancer or if it is benign, but most uncertain variants are reclassified to benign. It should not be used to make medical management decisions. With time, we suspect the laboratory will determine the significance of this variant, if any. If the laboratory reclassifies this variant, we will attempt to contact Ms. Sample to discuss it further.   Even though a  pathogenic variant was not identified, possible explanations for the cancer in the family may include: There may be no hereditary risk for cancer in the family. The cancers in Ms. Vacca and/or her family may be due to other genetic or environmental factors. There may be a gene mutation in one of these genes that current testing methods cannot detect, but that chance is small. There could be another gene that has not yet been discovered, or that we have not yet tested, that is responsible for the cancer diagnoses in the family.  It is also possible there is a hereditary cause for the cancer in the family that Ms. Newburn did not inherit. The variants of uncertain significance detected in the ALK and AXIN2  genes may be reclassified as a pathogenic variant in the future. At this time, we do not know if this variant increases the risk for cancer.  Therefore, it is important to remain in touch with cancer genetics in the future so that we can continue to offer Ms. Halabi the most up to date genetic testing.   ADDITIONAL GENETIC TESTING:  We discussed with Ms. Zuluaga that her genetic testing was fairly extensive.  If there are genes identified to increase cancer risk that can be analyzed in the future, we would be happy to discuss and coordinate this testing at that time.    CANCER SCREENING RECOMMENDATIONS:  Ms. Sylvan test result is considered negative (normal).  This means that we have not identified a hereditary cause for her personal and family history of cancer at this time. Most cancers happen by chance and this negative test suggests that her cancer may fall into this category.    An individual's cancer risk and medical management are not determined by genetic test results alone. Overall cancer risk assessment incorporates additional factors, including personal medical history, family history, and any available genetic information that may result in a personalized plan for cancer prevention and surveillance. Therefore, it is recommended she continue to follow the cancer management and screening guidelines provided by her oncology and primary healthcare provider.   RECOMMENDATIONS FOR FAMILY MEMBERS:   Since she did not inherit a mutation in a cancer predisposition gene included on this panel, her children could not have inherited a mutation from her in one of these genes. Individuals in this family might be at some increased risk of developing cancer, over the general population risk, due to the family history of cancer. We recommend women in this family have a yearly mammogram beginning at age 44, or 65 years younger than the earliest onset of cancer, an annual clinical breast exam, and perform  monthly breast self-exams.  Other members of the family may still carry a pathogenic variant in one of these genes that Ms. Corbridge did not inherit. Based on the family history, we recommend her cousin, who was diagnosed with breast cancer at age 93, have genetic counseling and testing. Ms. Fukushima will let us know if we can be of any assistance in coordinating genetic counseling and/or testing for this family member.   We do not recommend familial testing for the ALK and AXIN2 variant of uncertain significance (VUS).  FOLLOW-UP:  Lastly, we discussed with Ms. Aguiniga that cancer genetics is a rapidly advancing field and it is possible that new genetic tests will be appropriate for her and/or her family members in the future. We encouraged her to remain in contact with cancer genetics on an annual basis so we can update her personal  and family histories and let her know of advances in cancer genetics that may benefit this family.   Our contact number was provided. Ms. Owensby questions were answered to her satisfaction, and she knows she is welcome to call us at anytime with additional questions or concerns.   Faith Rogue, MS, [facility] Genetic Counselor [email or web address] Phone: [phone]  The patient was seen for a total of 15 minutes in face-to-face genetic counseling. Patient brought her daughter. . Dr. Grayland Ormond was available for discussion regarding this case.   _______________________________________________________________________ For Office Staff:  Number of people involved in session: 1 Was an Intern/ student involved with case: no

## 2021-06-15 NOTE — Progress Notes (Signed)
Hematology/Oncology follow up note [facility] [phone] [phone]   Patient Care Team: Verdie Shire, MD as PCP - General (Family Medicine)  REFERRING PROVIDER: Verdie Shire, MD  CHIEF COMPLAINTS/REASON FOR VISIT:  Follow-up for stage I left breast cancer.  HISTORY OF PRESENTING ILLNESS:   Breanna [name] is a  60 y.o.  female with PMH listed below was seen in consultation at the request of  Bowen, Lauren, MD  for evaluation of left breast cancer 04/11/2021, bilateral screening mammogram recommend further evaluation for possible mass in the left breast. 04/21/2021, diagnostic unilateral left mammogram showed a 3 mm suspicious mass in the posterior upper inner left breast without sonographic correlate.  Left axillary shows normal lymph nodes. 9/14/ 2022, patient underwent upper inner left breast stereotactic guided core biopsy.  Invasive mammary carcinoma, no special type.  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 ER 90%, PR 90%, HER2 negative. Patient was referred to establish care with oncology.  She has met surgeon Dr. Celine Ahr. Menarche 71-11 yo Patient has 1 child Age at first birth, 51 She has remote history of birth control pill use. LMP 48 Denies any previous breast biopsies.  Denies any hormone replacement therapy.  Family history is positive for mother with breast cancer at age of 82.  Paternal cousin had breast cancer at age of 14.  Paternal uncle and had lung cancer.   INTERVAL HISTORY Breanna [name] is a 60 y.o. female who has above history reviewed by me today presents for follow up visit for management of stage I left breast cancer 06/07/2021, patient underwent lumpectomy and sentinel lymph node biopsy.  No evidence of residual invasive mammary carcinoma.  Lymph nodes were negative.  pT1a pN0 ER+ PR+ HER2 negative Patient reports some soreness at the surgical sites.  She has appointment with  surgery.    Review of Systems  Constitutional:  Negative for appetite change, chills, fatigue and fever.  HENT:   Negative for hearing loss and voice change.   Eyes:  Negative for eye problems.  Respiratory:  Negative for chest tightness and cough.   Cardiovascular:  Negative for chest pain.  Gastrointestinal:  Negative for abdominal distention, abdominal pain and blood in stool.  Endocrine: Negative for hot flashes.  Genitourinary:  Negative for difficulty urinating and frequency.   Musculoskeletal:  Negative for arthralgias.  Skin:  Negative for itching and rash.  Neurological:  Negative for extremity weakness.  Hematological:  Negative for adenopathy.  Psychiatric/Behavioral:  Negative for confusion.    MEDICAL HISTORY:  Past Medical History:  Diagnosis Date   Asthma    Breast cancer ([place])    left   Cancer ([place])    GERD (gastroesophageal reflux disease)    Hypothyroidism    Ventral hernia     SURGICAL HISTORY: Past Surgical History:  Procedure Laterality Date   BREAST BIOPSY Left 05/10/2021   Affirm bx- mass/"coil" marker-path pending   BREAST [place] BIOPSY Left 06/07/2021   Procedure: BREAST [place] LYMPH NODE BIOPSY;  Surgeon: Ronny Bacon, MD;  Location: [facility] ORS;  Service: General;  Laterality: Left;   CESAREAN SECTION     COLONOSCOPY     ENDOMETRIAL BIOPSY     HERNIA REPAIR      SOCIAL HISTORY: Social History   Socioeconomic History   Marital status: Married    Spouse name: Not on file   Number of children: Not on file   Years of  education: Not on file   Highest education level: Not on file  Occupational History   Not on file  Tobacco Use   Smoking status: Never   Smokeless tobacco: Never  Vaping Use   Vaping Use: Never used  Substance and Sexual Activity   Alcohol use: Never   Drug use: Never   Sexual activity: Not on file  Other Topics Concern    Not on file  Social History Narrative   Not on file   Social Determinants of Health   Financial Resource Strain: Not on file  Food Insecurity: Not on file  Transportation Needs: Not on file  Physical Activity: Not on file  Stress: Not on file  Social Connections: Not on file  Intimate Partner Violence: Not on file    FAMILY HISTORY: Family History  Problem Relation Age of Onset   Breast cancer Mother 42   Breast cancer Cousin 58       pat cousin    ALLERGIES:  is allergic to latex, other, and sudafed [pseudoephedrine hcl].  MEDICATIONS:  Current Outpatient Medications  Medication Sig Dispense Refill   acetaminophen (TYLENOL) 500 MG tablet Take 500 mg by mouth every 6 (six) hours as needed.     albuterol (VENTOLIN HFA) 108 (90 Base) MCG/ACT inhaler Inhale 2 puffs into the lungs every 6 (six) hours as needed for wheezing or shortness of breath. 8 g 3   budesonide-formoterol (SYMBICORT) 160-4.5 MCG/ACT inhaler Inhale 2 puffs into the lungs 2 (two) times daily.     Calcium Carbonate-Vitamin D 600-200 MG-UNIT TABS Take 1 tablet by mouth daily.     Cetirizine HCl 10 MG CAPS Take 10 mg by mouth at bedtime.     Cholecalciferol 50 MCG (2000 UT) CAPS Take 2,000 Units by mouth daily.     esomeprazole (NEXIUM) 40 MG capsule Take 1 capsule (40 mg total) by mouth daily before supper. 30 capsule 1   fluticasone (FLONASE) 50 MCG/ACT nasal spray Place 1 spray into both nostrils as needed for allergies or rhinitis.     HYDROcodone-acetaminophen (NORCO/VICODIN) 5-325 MG tablet Take 1 tablet by mouth every 6 (six) hours as needed for moderate pain. 15 tablet 0   ibuprofen (ADVIL) 800 MG tablet Take 1 tablet (800 mg total) by mouth every 8 (eight) hours as needed. 30 tablet 0   levothyroxine (SYNTHROID) 75 MCG tablet Take 75 mcg by mouth daily before breakfast.     lidocaine-prilocaine (EMLA) cream Apply to the areola of the surgical breast  and cover with plastic wrap one hour prior to leaving  for surgery. 5 g 0   montelukast (SINGULAIR) 10 MG tablet Take 10 mg by mouth at bedtime.     sodium chloride (OCEAN) 0.65 % SOLN nasal spray Place 1 spray into both nostrils daily.     Spacer/Aero-Holding Chambers (AEROCHAMBER MV) inhaler 1 each by Miscellaneous route two (2) times a day as needed.     triamcinolone cream (KENALOG) 0.1 % Apply 1 application topically daily as needed (Rash).     No current facility-administered medications for this visit.     PHYSICAL EXAMINATION: ECOG PERFORMANCE STATUS: 0 - Asymptomatic Vitals:   06/15/21 1444  BP: 137/73  Pulse: 73  Resp: 16  Temp: 98.1 F (36.7 C)  SpO2: 98%   Filed Weights   06/15/21 1444  Weight: 256 lb (116.1 kg)    Physical Exam Constitutional:      General: She is not in acute distress. HENT:  Head: Normocephalic and atraumatic.  Eyes:     General: No scleral icterus. Cardiovascular:     Rate and Rhythm: Normal rate and regular rhythm.     Heart sounds: Normal heart sounds.  Pulmonary:     Effort: Pulmonary effort is normal. No respiratory distress.     Breath sounds: No wheezing.  Abdominal:     General: Bowel sounds are normal. There is no distension.     Palpations: Abdomen is soft.  Musculoskeletal:        General: No deformity. Normal range of motion.     Cervical back: Normal range of motion and neck supple.  Skin:    General: Skin is warm and dry.     Findings: No erythema or rash.  Neurological:     Mental Status: She is alert and oriented to person, place, and time. Mental status is at baseline.     Cranial Nerves: No cranial nerve deficit.     Coordination: Coordination normal.  Psychiatric:        Mood and Affect: Mood normal.    LABORATORY DATA:  I have reviewed the data as listed Lab Results  Component Value Date   WBC 9.3 05/23/2021   HGB 13.9 05/23/2021   HCT 42.8 05/23/2021   MCV 92.8 05/23/2021   PLT 333 05/23/2021   Recent Labs    05/23/21 1607  NA 138  K 4.2  CL 103   CO2 26  GLUCOSE 100*  BUN 17  CREATININE 0.82  CALCIUM 9.4  GFRNONAA >60  PROT 8.4*  ALBUMIN 4.2  AST 14*  ALT 10  ALKPHOS 51  BILITOT 0.6    Iron/TIBC/Ferritin/ %Sat No results found for: IRON, TIBC, FERRITIN, IRONPCTSAT    RADIOGRAPHIC STUDIES: I have personally reviewed the radiological images as listed and agreed with the findings in the report. NM Sentinel Node Inj-No Rpt (Breast)  Result Date: 06/07/2021 Sulfur Colloid was injected by the Nuclear Medicine Technologist for sentinel lymph node localization.   MM Breast Surgical Specimen  Result Date: 06/07/2021 CLINICAL DATA:  Status post excisional biopsy of the left breast. EXAM: SPECIMEN RADIOGRAPH OF THE LEFT BREAST COMPARISON:  Previous exam(s). FINDINGS: Status post excision of the left breast. Two specimen radiographs were obtained. The radioactive seed and biopsy marker clip are present, completely intact, and were marked for pathology. IMPRESSION: Specimen radiograph of the left breast. Electronically Signed   By: Lillia Mountain M.D.   On: 06/07/2021 12:07  MM LT RADIO FREQUENCY TAG LOC MAMMO GUIDE  Result Date: 06/05/2021 CLINICAL DATA:  Patient presents for radiofrequency device localization of a left breast carcinoma. EXAM: MAMMOGRAPHIC GUIDED RADIOFREQUENCY DEVICE LOCALIZATION OF THE LEFT BREAST COMPARISON:  Previous exam(s) FINDINGS: Patient presents for radiofrequency device localization prior to . I met with the patient and we discussed the procedure of radiofrequency device localization including benefits and alternatives. We discussed the high likelihood of a successful procedure. We discussed the risks of the procedure including infection, bleeding, tissue injury and further surgery. Informed, written consent was given. The usual time-out protocol was performed immediately prior to the procedure. Using mammographic guidance, sterile technique, 1% lidocaine as local anesthesia, a radiofrequency tag was used to  localize the small mass and associated coil shaped biopsy clip using a medial approach. The follow-up mammogram images confirm that the RF device is in the expected location and are marked for the surgeon. Follow-up survey of the patient confirms the presence of the RF device. The patient tolerated the  procedure well and was released from the [facility]. IMPRESSION: Radiofrequency device localization of the LEFT breast. No apparent complications. Electronically Signed   By: Lajean Manes M.D.   On: 06/05/2021 16:02     ASSESSMENT & PLAN:  1. Invasive carcinoma of breast ([place])   2. Family history of cancer    #stage Ia left invasive breast carcinoma, pT1a pN0 ER+ PR+ HER2- Status post lumpectomy with sentinel lymph node biopsy. Pathology reports reviewed with patient and her daughter. No need for chemotherapy. Recommend patient to establish care with radiation oncology for adjuvant radiation. Discussed briefly that after she finishes radiation, recommend adjuvant aromatase inhibitor. Recommend bone density testing baseline. She agrees with the plan  Family history of cancer, she is going to establish care with genetic counselor today.   Orders Placed This Encounter  Procedures   Ambulatory referral to Radiation Oncology    Referral Priority:   Routine    Referral Type:   Consultation    Referral Reason:   Specialty Services Required    Requested Specialty:   Radiation Oncology    Number of Visits Requested:   1    All questions were answered. The patient knows to call the clinic with any problems questions or concerns.  cc Bowen, Lauren, MD    Return of visit: TBD.  I will plan to see her 2 weeks after she finishes radiation.  I asked patient to call RN navigator after she knows her radiation schedule.  Earlie Server, MD, PhD Hematology Oncology [place] [facility]  06/15/2021

## 2021-06-15 NOTE — Patient Instructions (Signed)
If you have any concerns or questions, please feel free to call our office.   Lumpectomy, Care After This sheet gives you information about how to care for yourself after your procedure. Your health care provider may also give you more specific instructions. If you have problems or questions, contact your health care provider. What can I expect after the procedure? After the procedure, it is common to have: Breast swelling. Breast tenderness. Stiffness in your arm or shoulder. A change in the shape and feel of your breast. Scar tissue that feels hard to the touch in the area where the lump was removed. Follow these instructions at home: Medicines Take over-the-counter and prescription medicines only as told by your health care provider. If you were prescribed an antibiotic medicine, take it as told by your health care provider. Do not stop taking the antibiotic even if you start to feel better. Ask your health care provider if the medicine prescribed to you: Requires you to avoid driving or using heavy machinery. Can cause constipation. You may need to take these actions to prevent or treat constipation: Drink enough fluid to keep your urine pale yellow. Take over-the-counter or prescription medicines. Eat foods that are high in fiber, such as beans, whole grains, and fresh fruits and vegetables. Limit foods that are high in fat and processed sugars, such as fried or sweet foods. Incision care   Follow instructions from your health care provider about how to take care of your incision. Make sure you: Wash your hands with soap and water before and after you change your bandage (dressing). If soap and water are not available, use hand sanitizer. Change your dressing as told by your health care provider. Leave stitches (sutures), skin glue, or adhesive strips in place. These skin closures may need to stay in place for 2 weeks or longer. If adhesive strip edges start to loosen and curl up, you  may trim the loose edges. Do not remove adhesive strips completely unless your health care provider tells you to do that. Check your incision area every day for signs of infection. Check for: More redness, swelling, or pain. Fluid or blood. Warmth. Pus or a bad smell. Keep your dressing clean and dry. If you were sent home with a surgical drain in place, follow instructions from your health care provider about emptying it. Bathing Do not take baths, swim, or use a hot tub until your health care provider approves. Ask your health care provider if you may take showers. You may only be allowed to take sponge baths. Activity Rest as told by your health care provider. Avoid sitting for a long time without moving. Get up to take short walks every 1-2 hours. This is important to improve blood flow and breathing. Ask for help if you feel weak or unsteady. Return to your normal activities as told by your health care provider. Ask your health care provider what activities are safe for you. Be careful to avoid any activities that could cause an injury to your arm on the side of your surgery. Do not lift anything that is heavier than 10 lb (4.5 kg), or the limit that you are told, until your health care provider says that it is safe. Avoid lifting with the arm that is on the side of your surgery. Do not carry heavy objects on your shoulder on the side of your surgery. Do exercises to keep your shoulder and arm from getting stiff and swollen. Talk with your health care  provider about which exercises are safe for you. General instructions Wear a supportive bra as told by your health care provider. Raise (elevate) your arm above the level of your heart while you are sitting or lying down. Do not wear tight jewelry on your arm, wrist, or fingers on the side of your surgery. Keep all follow-up visits as told by your health care provider. This is important. You may need to be screened for extra fluid around  the lymph nodes and swelling in the breast and arm (lymphedema). Follow instructions from your health care provider about how often you should be checked. If you had any lymph nodes removed during your procedure, be sure to tell all of your health care providers. This is important information to share before you are involved in certain procedures, such as having blood tests or having your blood pressure taken. Contact a health care provider if: You develop a rash. You have a fever. Your pain medicine is not working. You have swelling, weakness, or numbness in your arm that does not improve after a few weeks. You have new swelling in your breast. You have any of these signs of infection: More redness, swelling, or pain in your incision area. Fluid or blood coming from your incision. Warmth coming from the incision area. Pus or a bad smell coming from your incision. Get help right away if you have: Very bad pain in your breast or arm. Swelling in your legs or arms. Redness, warmth, or pain in your leg or arm. Chest pain. Difficulty breathing. Summary After the procedure, it is common to have breast tenderness, swelling in your breast, and stiffness in your arm and shoulder. Follow instructions from your health care provider about how to take care of your incision. Do not lift anything that is heavier than 10 lb (4.5 kg), or the limit that you are told, until your health care provider says that it is safe. Avoid lifting with the arm that is on the side of your surgery. If you had any lymph nodes removed during your procedure, be sure to tell all of your health care providers. This is important information to share before you are involved in certain procedures, such as having blood tests or having your blood pressure taken. This information is not intended to replace advice given to you by your health care provider. Make sure you discuss any questions you have with your health care  provider. Document Revised: 02/16/2019 Document Reviewed: 02/16/2019 Elsevier Patient Education  [place].

## 2021-06-15 NOTE — Progress Notes (Signed)
[facility] POST-OP OFFICE VISIT  06/15/2021  HPI: Breanna [name] is a 60 y.o. female 8 days s/p RF ID tag left breast lumpectomy with sentinel lymph node biopsy.  Patient is already well aware of her pathology as reviewed with Dr. Tasia Catchings earlier today.  She has had resolving bruising, some upper outer quadrant/axillary discomfort.  But otherwise no fevers or chills or wound drainage or skin changes aside from the bruising.  Vital signs: There were no vitals taken for this visit.   Physical Exam: Constitutional: She appears well  Skin: Resolving ecchymosis of the dependent breast and left axilla  Assessment/Plan: This is a 60 y.o. female 8 days s/p RF ID tag left breast lumpectomy with sentinel lymph node biopsy.  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 Tasia Catchings is already initiated the continuance of her breast conserving treatment for stage I left breast cancer.  I can be of any other help I will be glad to see her back in 6 months with her follow-up imaging of the left breast.   Ronny Bacon M.D., [facility] 06/15/2021, 4:40 PM

## 2021-06-16 NOTE — Addendum Note (Signed)
Addended by: Carl Best H on: 06/16/2021 08:10 AM   Modules accepted: Orders

## 2021-06-18 ENCOUNTER — Other Ambulatory Visit: Payer: Self-pay | Admitting: Pulmonary Disease

## 2021-06-19 ENCOUNTER — Encounter: Payer: Self-pay | Admitting: *Deleted

## 2021-06-19 ENCOUNTER — Telehealth: Payer: Self-pay | Admitting: *Deleted

## 2021-06-19 NOTE — Telephone Encounter (Signed)
Left message for patient, her FMLA paperwork is ready and at the front for pickup.

## 2021-06-21 ENCOUNTER — Institutional Professional Consult (permissible substitution): Payer: BC Managed Care – PPO | Admitting: Radiation Oncology

## 2021-06-26 ENCOUNTER — Other Ambulatory Visit: Payer: Self-pay

## 2021-06-26 ENCOUNTER — Ambulatory Visit
Admission: RE | Admit: 2021-06-26 | Discharge: 2021-06-26 | Disposition: A | Discharge status: Home / Self Care | Payer: BC Managed Care – PPO | Source: Ambulatory Visit | Attending: Radiation Oncology | Admitting: Radiation Oncology

## 2021-06-26 VITALS — BP 143/83 | HR 67 | Temp 97.0°F | Resp 20 | Wt 260.0 lb

## 2021-06-26 DIAGNOSIS — Z79899 Other long term (current) drug therapy: Secondary | ICD-10-CM | POA: Insufficient documentation

## 2021-06-26 DIAGNOSIS — C50212 Malignant neoplasm of upper-inner quadrant of left female breast: Secondary | ICD-10-CM

## 2021-06-26 DIAGNOSIS — K449 Diaphragmatic hernia without obstruction or gangrene: Secondary | ICD-10-CM | POA: Insufficient documentation

## 2021-06-26 DIAGNOSIS — K219 Gastro-esophageal reflux disease without esophagitis: Secondary | ICD-10-CM | POA: Diagnosis not present

## 2021-06-26 DIAGNOSIS — J45909 Unspecified asthma, uncomplicated: Secondary | ICD-10-CM | POA: Insufficient documentation

## 2021-06-26 DIAGNOSIS — D0512 Intraductal carcinoma in situ of left breast: Secondary | ICD-10-CM | POA: Diagnosis present

## 2021-06-26 DIAGNOSIS — Z170 Estrogen receptor positive status [ER+]: Secondary | ICD-10-CM | POA: Insufficient documentation

## 2021-06-26 DIAGNOSIS — E039 Hypothyroidism, unspecified: Secondary | ICD-10-CM | POA: Diagnosis not present

## 2021-06-26 DIAGNOSIS — Z803 Family history of malignant neoplasm of breast: Secondary | ICD-10-CM | POA: Insufficient documentation

## 2021-06-26 NOTE — Consult Note (Signed)
NEW PATIENT EVALUATION  Name: Breanna [name]  MRN: [id number]  Date:   06/26/2021     DOB: 04/10/1961   This 60 y.o. 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  REFERRING PHYSICIAN: Verdie Shire, MD  CHIEF COMPLAINT:  Chief Complaint  Patient presents with   Breast Cancer    DIAGNOSIS: The encounter diagnosis was Malignant neoplasm of upper-inner quadrant of left breast in female, estrogen receptor positive ([place]).   PREVIOUS INVESTIGATIONS:  Mammogram and ultrasound reviewed Pathology report reviewed Clinical notes reviewed  HPI: Patient is a 60 year old female who presented with an abnormal mammogram of the left breast showing a possible mass in the posterior medial and slightly upper left breast measuring approximately 3 mm.  She underwent targeted left breast ultrasound with normal axilla.  Confirmed 3 mm suspicious mass.  She underwent stereotactic core biopsy showing invasive mammary carcinoma no special type measuring approximate 2.5 mm.  She then underwent wide local excision and sentinel node biopsy showing no residual invasive mammary carcinoma.  2 axillary lymph nodes were negative for metastatic disease.  She has done well postoperatively.  She has seen medical oncology with recommendation for antiestrogen therapy no chemotherapy.  Seen today for radiation oncology opinion she is doing well specifically denies breast tenderness cough or bone pain.  PLANNED TREATMENT REGIMEN: Left whole breast radiation  PAST MEDICAL HISTORY:  has a past medical history of Asthma, Breast cancer ([place]), Cancer ([place]), GERD (gastroesophageal reflux disease), Hypothyroidism, and Ventral hernia.    PAST SURGICAL HISTORY:  Past Surgical History:  Procedure Laterality Date   BREAST BIOPSY Left 05/10/2021   Affirm bx-  mass/"coil" marker-path pending   BREAST LUMPECTOMY,RADIO FREQ LOCALIZER,AXILLARY SENTINEL LYMPH NODE BIOPSY Left 06/07/2021   Procedure: [place] LYMPH NODE BIOPSY;  Surgeon: Ronny Bacon, MD;  Location: [facility] ORS;  Service: General;  Laterality: Left;   CESAREAN SECTION     COLONOSCOPY     ENDOMETRIAL BIOPSY     HERNIA REPAIR      FAMILY HISTORY: family history includes Breast cancer (age of onset: 36) in her cousin; Breast cancer (age of onset: 65) in her mother.  SOCIAL HISTORY:  reports that she has never smoked. She has never used smokeless tobacco. She reports that she does not drink alcohol and does not use drugs.  ALLERGIES: Latex, Other, and Sudafed [pseudoephedrine hcl]  MEDICATIONS:  Current Outpatient Medications  Medication Sig Dispense Refill   acetaminophen (TYLENOL) 500 MG tablet Take 500 mg by mouth every 6 (six) hours as needed.     albuterol (VENTOLIN HFA) 108 (90 Base) MCG/ACT inhaler Inhale 2 puffs into the lungs every 6 (six) hours as needed for wheezing or shortness of breath. 8 g 3   budesonide-formoterol (SYMBICORT) 160-4.5 MCG/ACT inhaler Inhale 2 puffs into the lungs 2 (two) times daily.     Calcium Carbonate-Vitamin D 600-200 MG-UNIT TABS Take 1 tablet by mouth daily.     Cetirizine HCl 10 MG CAPS Take 10 mg by mouth at bedtime.     Cholecalciferol 50 MCG (2000 UT) CAPS Take 2,000 Units by mouth daily.     esomeprazole (NEXIUM) 40 MG capsule TAKE 1 CAPSULE (40 MG TOTAL) BY MOUTH DAILY BEFORE SUPPER. 30 capsule 1   fluticasone (FLONASE) 50 MCG/ACT nasal spray Place 1 spray into both nostrils as  needed for allergies or rhinitis.     HYDROcodone-acetaminophen (NORCO/VICODIN) 5-325 MG tablet Take 1 tablet by mouth every 6 (six) hours as needed for moderate pain. 15 tablet 0   ibuprofen (ADVIL) 800 MG tablet Take 1 tablet (800 mg total) by mouth every 8 (eight) hours as needed. 30 tablet 0   levothyroxine  (SYNTHROID) 75 MCG tablet Take 75 mcg by mouth daily before breakfast.     lidocaine-prilocaine (EMLA) cream Apply to the areola of the surgical breast  and cover with plastic wrap one hour prior to leaving for surgery. 5 g 0   montelukast (SINGULAIR) 10 MG tablet Take 10 mg by mouth at bedtime.     sodium chloride (OCEAN) 0.65 % SOLN nasal spray Place 1 spray into both nostrils daily.     Spacer/Aero-Holding Chambers (AEROCHAMBER MV) inhaler 1 each by Miscellaneous route two (2) times a day as needed.     triamcinolone cream (KENALOG) 0.1 % Apply 1 application topically daily as needed (Rash).     No current facility-administered medications for this encounter.    ECOG PERFORMANCE STATUS:  0 - Asymptomatic  REVIEW OF SYSTEMS: Patient denies any weight loss, fatigue, weakness, fever, chills or night sweats. Patient denies any loss of vision, blurred vision. Patient denies any ringing  of the ears or hearing loss. No irregular heartbeat. Patient denies heart murmur or history of fainting. Patient denies any chest pain or pain radiating to her upper extremities. Patient denies any shortness of breath, difficulty breathing at night, cough or hemoptysis. Patient denies any swelling in the lower legs. Patient denies any nausea vomiting, vomiting of blood, or coffee ground material in the vomitus. Patient denies any stomach pain. Patient states has had normal bowel movements no significant constipation or diarrhea. Patient denies any dysuria, hematuria or significant nocturia. Patient denies any problems walking, swelling in the joints or loss of balance. Patient denies any skin changes, loss of hair or loss of weight. Patient denies any excessive worrying or anxiety or significant depression. Patient denies any problems with insomnia. Patient denies excessive thirst, polyuria, polydipsia. Patient denies any swollen glands, patient denies easy bruising or easy bleeding. Patient denies any recent infections,  allergies or URI. Patient "s visual fields have not changed significantly in recent time.   PHYSICAL EXAM: BP (!) 143/83   Pulse 67   Temp (!) 97 F (36.1 C) (Tympanic)   Resp 20   Wt 260 lb (117.9 kg)   BMI 39.53 kg/m  Patient is extremely large pendulous breasts.  She is status post wide local excision with incision healing well.  She also has a sentinel node biopsy site which is also healing well.  No dominant masses noted in either breast.  No axillary or supraclavicular adenopathy is identified.  Well-developed well-nourished patient in NAD. HEENT reveals PERLA, EOMI, discs not visualized.  Oral cavity is clear. No oral mucosal lesions are identified. Neck is clear without evidence of cervical or supraclavicular adenopathy. Lungs are clear to A&P. Cardiac examination is essentially unremarkable with regular rate and rhythm without murmur rub or thrill. Abdomen is benign with no organomegaly or masses noted. Motor sensory and DTR levels are equal and symmetric in the upper and lower extremities. Cranial nerves II through XII are grossly intact. Proprioception is intact. No peripheral adenopathy or edema is identified. No motor or sensory levels are noted. Crude visual fields are within normal range.  LABORATORY DATA: Pathology report reviewed    RADIOLOGY RESULTS: Mammogram and  ultrasound reviewed compatible with above-stated findings   IMPRESSION: Stage Ia ER/PR positive invasive mammary carcinoma of the left breast status post wide local excision and sentinel node biopsy in 60 year old female  PLAN: At this time I have recommended whole breast radiation.  Patient has extremely large pendulous breast making hypofractionated course of treatment difficult.  I would plan on delivering 5040 cGy in 28 fractions to her left breast boosting her scar another 1000 cGy using electron beam.  Risks and benefits of treatment including skin reaction fatigue alteration of blood counts possible inclusion  of superficial lung all were discussed in detail with the patient.  She seems to comprehend my treatment plan well.  I have personally set up and ordered CT simulation for next week.  Patient also will benefit from antiestrogen therapy after completion of radiation.  I would like to take this opportunity to thank you for allowing me to participate in the care of your patient.Noreene Filbert, MD

## 2021-06-28 ENCOUNTER — Other Ambulatory Visit: Payer: Self-pay

## 2021-06-28 ENCOUNTER — Inpatient Hospital Stay: Payer: BC Managed Care – PPO | Admitting: Occupational Therapy

## 2021-06-28 DIAGNOSIS — C50212 Malignant neoplasm of upper-inner quadrant of left female breast: Secondary | ICD-10-CM | POA: Insufficient documentation

## 2021-06-28 DIAGNOSIS — Z170 Estrogen receptor positive status [ER+]: Secondary | ICD-10-CM | POA: Insufficient documentation

## 2021-06-28 DIAGNOSIS — Z510 Encounter for antineoplastic radiation therapy: Secondary | ICD-10-CM | POA: Insufficient documentation

## 2021-06-28 DIAGNOSIS — C50912 Malignant neoplasm of unspecified site of left female breast: Secondary | ICD-10-CM | POA: Insufficient documentation

## 2021-06-28 DIAGNOSIS — C50919 Malignant neoplasm of unspecified site of unspecified female breast: Secondary | ICD-10-CM

## 2021-06-28 NOTE — Therapy (Signed)
[facility] [address] [place], [place], [place], [address] Phone: [phone]   Fax:  [phone]  Occupational Therapy Screen:  Patient Details  Name: Breanna [name] MRN: [id number] Date of Birth: September 07, 1960 No data recorded  Encounter Date: 06/28/2021   OT End of Session - 06/28/21 1157     Visit Number 0             Past Medical History:  Diagnosis Date   Asthma    Breast cancer ([place])    left   Cancer ([place])    GERD (gastroesophageal reflux disease)    Hypothyroidism    Ventral hernia     Past Surgical History:  Procedure Laterality Date   BREAST BIOPSY Left 05/10/2021   Affirm bx- mass/"coil" marker-path pending   BREAST [place] Left 06/07/2021   Procedure: BREAST [place];  Surgeon: Ronny Bacon, MD;  Location: [facility] ORS;  Service: General;  Laterality: Left;   CESAREAN SECTION     COLONOSCOPY     ENDOMETRIAL BIOPSY     HERNIA REPAIR      There were no vitals filed for this visit.   Subjective Assessment - 06/28/21 1154     Subjective  Doing okay - some soreness still under my arm -but doing okay -starting radiation next week - my Left breast feels heavy -but no swelling that I see    Currently in Pain? No/denies                 LYMPHEDEMA/ONCOLOGY QUESTIONNAIRE - 06/28/21 0001       Right Upper Extremity Lymphedema   15 cm Proximal to Olecranon Process 40 cm    10 cm Proximal to Olecranon Process 39 cm    Olecranon Process 30.5 cm    15 cm Proximal to Ulnar Styloid Process 30 cm    10 cm Proximal to Ulnar Styloid Process 26 cm    Just Proximal to Ulnar Styloid Process 18.5 cm    Across Hand at [employer] 20 cm      Left Upper Extremity Lymphedema   15 cm Proximal to Olecranon Process 40 cm    10 cm Proximal to Olecranon Process 38.4 cm    Olecranon  Process 29.8 cm    15 cm Proximal to Ulnar Styloid Process 28 cm    10 cm Proximal to Ulnar Styloid Process 25 cm    Just Proximal to Ulnar Styloid Process 19 cm    Across Hand at [employer] 20 cm             Earlie Server, MD, PhD Hematology Oncology [place] [facility]   06/15/2021 ASSESSMENT & PLAN:  1. Invasive carcinoma of breast ([place])   2. Family history of cancer     #stage Ia left invasive breast carcinoma, pT1a pN0 ER+ PR+ HER2- Status post lumpectomy with sentinel lymph node biopsy. Pathology reports reviewed with patient and her daughter. No need for chemotherapy. Recommend patient to establish care with radiation oncology for adjuvant radiation. Discussed briefly that after she finishes radiation, recommend adjuvant aromatase inhibitor. Recommend bone density testing baseline. She agrees with the plan         Return of visit: TBD.  I will plan to see her 2 weeks after she finishes radiation.  I asked patient to call RN navigator after she knows her radiation schedule.  OT SCREEN 06/28/21: Pt  seen for screen s/p L lumpectomy for ROM and scar tissue assessment . AROM WNL but pull in axilla and L upper traps tight L breast feel heavy but denies swelling. Pt ed on AAROM stretches for shoulder flexion , ABD and to do during radiation  Scapula  squeezes - pt do have rounded shoulder - 3 x day 10 reps  Circumference for bilateral UE taken- WNL - baseline establish Pt ed on lymphedema signs, symptoms and prevention- hand out provided from [place] is low  Pt to follow up after radiation                                             Visit Diagnosis: Invasive carcinoma of breast [facility])    Problem List Patient Active Problem List   Diagnosis Date Noted   Genetic testing 06/15/2021   Malignant neoplasm of upper-inner quadrant of left breast in female, estrogen receptor positive ([place])  06/15/2021   Postprocedural hypothyroidism 02/24/2019   Allergic rhinitis 03/21/2013   Obesity 03/21/2013    Rosalyn Gess, OTR/L,CLT 06/28/2021, 11:57 AM  [facility] [address], [place], [place], [address] Phone: [phone]   Fax:  [phone]  Name: HONEY ZAKARIAN MRN: [id number] Date of Birth: [birth year]

## 2021-07-04 ENCOUNTER — Ambulatory Visit
Admission: RE | Admit: 2021-07-04 | Discharge: 2021-07-04 | Disposition: A | Discharge status: Home / Self Care | Payer: BC Managed Care – PPO | Source: Ambulatory Visit | Attending: Radiation Oncology | Admitting: Radiation Oncology

## 2021-07-04 DIAGNOSIS — C50912 Malignant neoplasm of unspecified site of left female breast: Secondary | ICD-10-CM | POA: Insufficient documentation

## 2021-07-04 DIAGNOSIS — C50212 Malignant neoplasm of upper-inner quadrant of left female breast: Secondary | ICD-10-CM | POA: Diagnosis present

## 2021-07-04 DIAGNOSIS — Z170 Estrogen receptor positive status [ER+]: Secondary | ICD-10-CM | POA: Diagnosis not present

## 2021-07-04 DIAGNOSIS — Z510 Encounter for antineoplastic radiation therapy: Secondary | ICD-10-CM | POA: Diagnosis present

## 2021-07-07 ENCOUNTER — Other Ambulatory Visit: Payer: Self-pay | Admitting: *Deleted

## 2021-07-07 DIAGNOSIS — Z510 Encounter for antineoplastic radiation therapy: Secondary | ICD-10-CM | POA: Diagnosis not present

## 2021-07-07 DIAGNOSIS — C50212 Malignant neoplasm of upper-inner quadrant of left female breast: Secondary | ICD-10-CM

## 2021-07-07 DIAGNOSIS — Z170 Estrogen receptor positive status [ER+]: Secondary | ICD-10-CM

## 2021-07-10 ENCOUNTER — Telehealth: Payer: Self-pay | Admitting: Surgery

## 2021-07-10 NOTE — Telephone Encounter (Signed)
Pt called requesting to speak w/a member of the clinical team to discuss some concerns she has regarding the incision site.  Breanna [name] is s/p breast lumpectomy & SLN excision w/Dr. Christian Mate on 06/07/21.  She does start radiation tomorrow & would like to get a few questions answered.  [name] is best reached @ [phone].  Thank you

## 2021-07-10 NOTE — Telephone Encounter (Signed)
No answer, left message for patient to call back.

## 2021-07-10 NOTE — Telephone Encounter (Signed)
Breast lumpectomy 06/07/21 patient wanted to know if feeling a firm place in the incision was normal-she denies fever-chills-and just some minimal tenderness under her arm. No pain-she will call back if worsening symptoms arise.

## 2021-07-11 ENCOUNTER — Ambulatory Visit: Admission: RE | Admit: 2021-07-11 | Payer: BC Managed Care – PPO | Source: Ambulatory Visit

## 2021-07-11 DIAGNOSIS — Z510 Encounter for antineoplastic radiation therapy: Secondary | ICD-10-CM | POA: Diagnosis not present

## 2021-07-12 ENCOUNTER — Ambulatory Visit
Admission: RE | Admit: 2021-07-12 | Discharge: 2021-07-12 | Disposition: A | Discharge status: Home / Self Care | Payer: BC Managed Care – PPO | Source: Ambulatory Visit | Attending: Radiation Oncology | Admitting: Radiation Oncology

## 2021-07-12 DIAGNOSIS — Z510 Encounter for antineoplastic radiation therapy: Secondary | ICD-10-CM | POA: Diagnosis not present

## 2021-07-13 ENCOUNTER — Ambulatory Visit
Admission: RE | Admit: 2021-07-13 | Discharge: 2021-07-13 | Disposition: A | Discharge status: Home / Self Care | Payer: BC Managed Care – PPO | Source: Ambulatory Visit | Attending: Radiation Oncology | Admitting: Radiation Oncology

## 2021-07-13 DIAGNOSIS — Z510 Encounter for antineoplastic radiation therapy: Secondary | ICD-10-CM | POA: Diagnosis not present

## 2021-07-14 ENCOUNTER — Ambulatory Visit
Admission: RE | Admit: 2021-07-14 | Discharge: 2021-07-14 | Disposition: A | Discharge status: Home / Self Care | Payer: BC Managed Care – PPO | Source: Ambulatory Visit | Attending: Radiation Oncology | Admitting: Radiation Oncology

## 2021-07-14 DIAGNOSIS — Z510 Encounter for antineoplastic radiation therapy: Secondary | ICD-10-CM | POA: Diagnosis not present

## 2021-07-17 ENCOUNTER — Ambulatory Visit
Admission: RE | Admit: 2021-07-17 | Discharge: 2021-07-17 | Disposition: A | Discharge status: Home / Self Care | Payer: BC Managed Care – PPO | Source: Ambulatory Visit | Attending: Radiation Oncology | Admitting: Radiation Oncology

## 2021-07-17 DIAGNOSIS — Z510 Encounter for antineoplastic radiation therapy: Secondary | ICD-10-CM | POA: Diagnosis not present

## 2021-07-18 ENCOUNTER — Ambulatory Visit
Admission: RE | Admit: 2021-07-18 | Discharge: 2021-07-18 | Disposition: A | Discharge status: Home / Self Care | Payer: BC Managed Care – PPO | Source: Ambulatory Visit | Attending: Radiation Oncology | Admitting: Radiation Oncology

## 2021-07-18 DIAGNOSIS — Z510 Encounter for antineoplastic radiation therapy: Secondary | ICD-10-CM | POA: Diagnosis not present

## 2021-07-19 ENCOUNTER — Ambulatory Visit
Admission: RE | Admit: 2021-07-19 | Discharge: 2021-07-19 | Disposition: A | Discharge status: Home / Self Care | Payer: BC Managed Care – PPO | Source: Ambulatory Visit | Attending: Radiation Oncology | Admitting: Radiation Oncology

## 2021-07-19 DIAGNOSIS — Z510 Encounter for antineoplastic radiation therapy: Secondary | ICD-10-CM | POA: Diagnosis not present

## 2021-07-22 ENCOUNTER — Other Ambulatory Visit: Payer: Self-pay | Admitting: Pulmonary Disease

## 2021-07-24 ENCOUNTER — Ambulatory Visit
Admission: RE | Admit: 2021-07-24 | Discharge: 2021-07-24 | Disposition: A | Discharge status: Home / Self Care | Payer: BC Managed Care – PPO | Source: Ambulatory Visit | Attending: Radiation Oncology | Admitting: Radiation Oncology

## 2021-07-24 DIAGNOSIS — Z510 Encounter for antineoplastic radiation therapy: Secondary | ICD-10-CM | POA: Diagnosis not present

## 2021-07-25 ENCOUNTER — Other Ambulatory Visit: Payer: Self-pay

## 2021-07-25 ENCOUNTER — Ambulatory Visit
Admission: RE | Admit: 2021-07-25 | Discharge: 2021-07-25 | Disposition: A | Discharge status: Home / Self Care | Payer: BC Managed Care – PPO | Source: Ambulatory Visit | Attending: Radiation Oncology | Admitting: Radiation Oncology

## 2021-07-25 ENCOUNTER — Inpatient Hospital Stay: Payer: BC Managed Care – PPO

## 2021-07-25 DIAGNOSIS — Z170 Estrogen receptor positive status [ER+]: Secondary | ICD-10-CM

## 2021-07-25 DIAGNOSIS — C50212 Malignant neoplasm of upper-inner quadrant of left female breast: Secondary | ICD-10-CM

## 2021-07-25 DIAGNOSIS — Z510 Encounter for antineoplastic radiation therapy: Secondary | ICD-10-CM | POA: Diagnosis not present

## 2021-07-25 LAB — CBC
HCT: 43.5 % (ref 36.0–46.0)
Hemoglobin: 13.9 g/dL (ref 12.0–15.0)
MCH: 30.3 pg (ref 26.0–34.0)
MCHC: 32 g/dL (ref 30.0–36.0)
MCV: 94.8 fL (ref 80.0–100.0)
Platelets: 293 10*3/uL (ref 150–400)
RBC: 4.59 MIL/uL (ref 3.87–5.11)
RDW: 14.6 % (ref 11.5–15.5)
WBC: 8.4 10*3/uL (ref 4.0–10.5)
nRBC: 0 % (ref 0.0–0.2)

## 2021-07-26 ENCOUNTER — Ambulatory Visit: Payer: BC Managed Care – PPO | Admitting: Pulmonary Disease

## 2021-07-26 ENCOUNTER — Ambulatory Visit
Admission: RE | Admit: 2021-07-26 | Discharge: 2021-07-26 | Disposition: A | Discharge status: Home / Self Care | Payer: BC Managed Care – PPO | Source: Ambulatory Visit | Attending: Radiation Oncology | Admitting: Radiation Oncology

## 2021-07-26 DIAGNOSIS — Z510 Encounter for antineoplastic radiation therapy: Secondary | ICD-10-CM | POA: Diagnosis not present

## 2021-07-27 ENCOUNTER — Ambulatory Visit
Admission: RE | Admit: 2021-07-27 | Discharge: 2021-07-27 | Disposition: A | Discharge status: Home / Self Care | Payer: BC Managed Care – PPO | Source: Ambulatory Visit | Attending: Radiation Oncology | Admitting: Radiation Oncology

## 2021-07-27 DIAGNOSIS — Z170 Estrogen receptor positive status [ER+]: Secondary | ICD-10-CM | POA: Insufficient documentation

## 2021-07-27 DIAGNOSIS — Z510 Encounter for antineoplastic radiation therapy: Secondary | ICD-10-CM | POA: Diagnosis present

## 2021-07-27 DIAGNOSIS — C50212 Malignant neoplasm of upper-inner quadrant of left female breast: Secondary | ICD-10-CM | POA: Diagnosis not present

## 2021-07-28 ENCOUNTER — Ambulatory Visit
Admission: RE | Admit: 2021-07-28 | Discharge: 2021-07-28 | Disposition: A | Discharge status: Home / Self Care | Payer: BC Managed Care – PPO | Source: Ambulatory Visit | Attending: Radiation Oncology | Admitting: Radiation Oncology

## 2021-07-28 DIAGNOSIS — Z510 Encounter for antineoplastic radiation therapy: Secondary | ICD-10-CM | POA: Diagnosis not present

## 2021-07-31 ENCOUNTER — Ambulatory Visit
Admission: RE | Admit: 2021-07-31 | Discharge: 2021-07-31 | Disposition: A | Discharge status: Home / Self Care | Payer: BC Managed Care – PPO | Source: Ambulatory Visit | Attending: Radiation Oncology | Admitting: Radiation Oncology

## 2021-07-31 DIAGNOSIS — Z510 Encounter for antineoplastic radiation therapy: Secondary | ICD-10-CM | POA: Diagnosis not present

## 2021-08-01 ENCOUNTER — Ambulatory Visit
Admission: RE | Admit: 2021-08-01 | Discharge: 2021-08-01 | Disposition: A | Discharge status: Home / Self Care | Payer: BC Managed Care – PPO | Source: Ambulatory Visit | Attending: Radiation Oncology | Admitting: Radiation Oncology

## 2021-08-01 DIAGNOSIS — Z510 Encounter for antineoplastic radiation therapy: Secondary | ICD-10-CM | POA: Diagnosis not present

## 2021-08-02 ENCOUNTER — Ambulatory Visit
Admission: RE | Admit: 2021-08-02 | Discharge: 2021-08-02 | Disposition: A | Discharge status: Home / Self Care | Payer: BC Managed Care – PPO | Source: Ambulatory Visit | Attending: Radiation Oncology | Admitting: Radiation Oncology

## 2021-08-02 DIAGNOSIS — Z510 Encounter for antineoplastic radiation therapy: Secondary | ICD-10-CM | POA: Diagnosis not present

## 2021-08-03 ENCOUNTER — Ambulatory Visit
Admission: RE | Admit: 2021-08-03 | Discharge: 2021-08-03 | Disposition: A | Discharge status: Home / Self Care | Payer: BC Managed Care – PPO | Source: Ambulatory Visit | Attending: Radiation Oncology | Admitting: Radiation Oncology

## 2021-08-03 DIAGNOSIS — Z510 Encounter for antineoplastic radiation therapy: Secondary | ICD-10-CM | POA: Diagnosis not present

## 2021-08-04 ENCOUNTER — Ambulatory Visit
Admission: RE | Admit: 2021-08-04 | Discharge: 2021-08-04 | Disposition: A | Discharge status: Home / Self Care | Payer: BC Managed Care – PPO | Source: Ambulatory Visit | Attending: Radiation Oncology | Admitting: Radiation Oncology

## 2021-08-04 DIAGNOSIS — Z510 Encounter for antineoplastic radiation therapy: Secondary | ICD-10-CM | POA: Diagnosis not present

## 2021-08-07 ENCOUNTER — Ambulatory Visit
Admission: RE | Admit: 2021-08-07 | Discharge: 2021-08-07 | Disposition: A | Discharge status: Home / Self Care | Payer: BC Managed Care – PPO | Source: Ambulatory Visit | Attending: Radiation Oncology | Admitting: Radiation Oncology

## 2021-08-07 DIAGNOSIS — Z510 Encounter for antineoplastic radiation therapy: Secondary | ICD-10-CM | POA: Diagnosis not present

## 2021-08-08 ENCOUNTER — Other Ambulatory Visit: Payer: Self-pay

## 2021-08-08 ENCOUNTER — Inpatient Hospital Stay: Payer: BC Managed Care – PPO

## 2021-08-08 ENCOUNTER — Ambulatory Visit
Admission: RE | Admit: 2021-08-08 | Discharge: 2021-08-08 | Disposition: A | Discharge status: Home / Self Care | Payer: BC Managed Care – PPO | Source: Ambulatory Visit | Attending: Radiation Oncology | Admitting: Radiation Oncology

## 2021-08-08 DIAGNOSIS — Z170 Estrogen receptor positive status [ER+]: Secondary | ICD-10-CM | POA: Insufficient documentation

## 2021-08-08 DIAGNOSIS — C50912 Malignant neoplasm of unspecified site of left female breast: Secondary | ICD-10-CM | POA: Insufficient documentation

## 2021-08-08 DIAGNOSIS — C50212 Malignant neoplasm of upper-inner quadrant of left female breast: Secondary | ICD-10-CM

## 2021-08-08 DIAGNOSIS — Z510 Encounter for antineoplastic radiation therapy: Secondary | ICD-10-CM | POA: Diagnosis not present

## 2021-08-08 LAB — CBC
HCT: 42.2 % (ref 36.0–46.0)
Hemoglobin: 13.6 g/dL (ref 12.0–15.0)
MCH: 30.6 pg (ref 26.0–34.0)
MCHC: 32.2 g/dL (ref 30.0–36.0)
MCV: 94.8 fL (ref 80.0–100.0)
Platelets: 309 10*3/uL (ref 150–400)
RBC: 4.45 MIL/uL (ref 3.87–5.11)
RDW: 14.3 % (ref 11.5–15.5)
WBC: 9 10*3/uL (ref 4.0–10.5)
nRBC: 0 % (ref 0.0–0.2)

## 2021-08-09 ENCOUNTER — Telehealth: Payer: Self-pay

## 2021-08-09 ENCOUNTER — Ambulatory Visit
Admission: RE | Admit: 2021-08-09 | Discharge: 2021-08-09 | Disposition: A | Discharge status: Home / Self Care | Payer: BC Managed Care – PPO | Source: Ambulatory Visit | Attending: Radiation Oncology | Admitting: Radiation Oncology

## 2021-08-09 ENCOUNTER — Telehealth: Payer: Self-pay | Admitting: Oncology

## 2021-08-09 DIAGNOSIS — Z510 Encounter for antineoplastic radiation therapy: Secondary | ICD-10-CM | POA: Diagnosis not present

## 2021-08-09 NOTE — Telephone Encounter (Signed)
Dr. Tasia Catchings typically sees post radiation pts 2 weeks after final rad tx. Please schedule MD only approx 1/19

## 2021-08-09 NOTE — Telephone Encounter (Signed)
-----   Message from Theodore Demark, RN sent at 08/07/2021 10:40 AM EST ----- Regarding: Follow-up appointment Good morning Breanna [name]! Breanna [name] messaged me that her last radiation is 08/31/21.  She would like to see Dr. Tasia Catchings on that day if possible for her post radiation appointment. Would you be able to take care of this?   Thank you, Webb Silversmith

## 2021-08-09 NOTE — Telephone Encounter (Signed)
Pt called to make an appt. Please call back at [phone]

## 2021-08-10 ENCOUNTER — Ambulatory Visit
Admission: RE | Admit: 2021-08-10 | Discharge: 2021-08-10 | Disposition: A | Discharge status: Home / Self Care | Payer: BC Managed Care – PPO | Source: Ambulatory Visit | Attending: Radiation Oncology | Admitting: Radiation Oncology

## 2021-08-10 DIAGNOSIS — Z510 Encounter for antineoplastic radiation therapy: Secondary | ICD-10-CM | POA: Diagnosis not present

## 2021-08-11 ENCOUNTER — Ambulatory Visit
Admission: RE | Admit: 2021-08-11 | Discharge: 2021-08-11 | Disposition: A | Discharge status: Home / Self Care | Payer: BC Managed Care – PPO | Source: Ambulatory Visit | Attending: Radiation Oncology | Admitting: Radiation Oncology

## 2021-08-11 DIAGNOSIS — Z510 Encounter for antineoplastic radiation therapy: Secondary | ICD-10-CM | POA: Diagnosis not present

## 2021-08-14 ENCOUNTER — Ambulatory Visit
Admission: RE | Admit: 2021-08-14 | Discharge: 2021-08-14 | Disposition: A | Discharge status: Home / Self Care | Payer: BC Managed Care – PPO | Source: Ambulatory Visit | Attending: Radiation Oncology | Admitting: Radiation Oncology

## 2021-08-14 DIAGNOSIS — Z510 Encounter for antineoplastic radiation therapy: Secondary | ICD-10-CM | POA: Diagnosis not present

## 2021-08-15 ENCOUNTER — Ambulatory Visit
Admission: RE | Admit: 2021-08-15 | Discharge: 2021-08-15 | Disposition: A | Discharge status: Home / Self Care | Payer: BC Managed Care – PPO | Source: Ambulatory Visit | Attending: Radiation Oncology | Admitting: Radiation Oncology

## 2021-08-15 ENCOUNTER — Other Ambulatory Visit: Payer: Self-pay | Admitting: *Deleted

## 2021-08-15 DIAGNOSIS — Z510 Encounter for antineoplastic radiation therapy: Secondary | ICD-10-CM | POA: Diagnosis not present

## 2021-08-15 MED ORDER — SILVER SULFADIAZINE 1 % EX CREA: 1.0000 "application " | TOPICAL_CREAM | Freq: Two times a day (BID) | CUTANEOUS | 2 refills | Status: DC

## 2021-08-16 ENCOUNTER — Ambulatory Visit
Admission: RE | Admit: 2021-08-16 | Discharge: 2021-08-16 | Disposition: A | Discharge status: Home / Self Care | Payer: BC Managed Care – PPO | Source: Ambulatory Visit | Attending: Radiation Oncology | Admitting: Radiation Oncology

## 2021-08-16 DIAGNOSIS — Z510 Encounter for antineoplastic radiation therapy: Secondary | ICD-10-CM | POA: Diagnosis not present

## 2021-08-17 ENCOUNTER — Ambulatory Visit
Admission: RE | Admit: 2021-08-17 | Discharge: 2021-08-17 | Disposition: A | Discharge status: Home / Self Care | Payer: BC Managed Care – PPO | Source: Ambulatory Visit | Attending: Radiation Oncology | Admitting: Radiation Oncology

## 2021-08-17 DIAGNOSIS — Z510 Encounter for antineoplastic radiation therapy: Secondary | ICD-10-CM | POA: Diagnosis not present

## 2021-08-18 ENCOUNTER — Ambulatory Visit
Admission: RE | Admit: 2021-08-18 | Discharge: 2021-08-18 | Disposition: A | Discharge status: Home / Self Care | Payer: BC Managed Care – PPO | Source: Ambulatory Visit | Attending: Radiation Oncology | Admitting: Radiation Oncology

## 2021-08-18 DIAGNOSIS — Z510 Encounter for antineoplastic radiation therapy: Secondary | ICD-10-CM | POA: Diagnosis not present

## 2021-08-22 ENCOUNTER — Other Ambulatory Visit: Payer: Self-pay

## 2021-08-22 ENCOUNTER — Ambulatory Visit
Admission: RE | Admit: 2021-08-22 | Discharge: 2021-08-22 | Disposition: A | Discharge status: Home / Self Care | Payer: BC Managed Care – PPO | Source: Ambulatory Visit | Attending: Radiation Oncology | Admitting: Radiation Oncology

## 2021-08-22 ENCOUNTER — Inpatient Hospital Stay: Payer: BC Managed Care – PPO | Attending: Radiation Oncology

## 2021-08-22 DIAGNOSIS — Z510 Encounter for antineoplastic radiation therapy: Secondary | ICD-10-CM | POA: Diagnosis not present

## 2021-08-22 DIAGNOSIS — Z170 Estrogen receptor positive status [ER+]: Secondary | ICD-10-CM | POA: Insufficient documentation

## 2021-08-22 DIAGNOSIS — C50912 Malignant neoplasm of unspecified site of left female breast: Secondary | ICD-10-CM | POA: Insufficient documentation

## 2021-08-22 LAB — CBC
HCT: 45 % (ref 36.0–46.0)
Hemoglobin: 14.2 g/dL (ref 12.0–15.0)
MCH: 29.8 pg (ref 26.0–34.0)
MCHC: 31.6 g/dL (ref 30.0–36.0)
MCV: 94.3 fL (ref 80.0–100.0)
Platelets: 286 10*3/uL (ref 150–400)
RBC: 4.77 MIL/uL (ref 3.87–5.11)
RDW: 14.2 % (ref 11.5–15.5)
WBC: 7.7 10*3/uL (ref 4.0–10.5)
nRBC: 0 % (ref 0.0–0.2)

## 2021-08-23 ENCOUNTER — Ambulatory Visit
Admission: RE | Admit: 2021-08-23 | Discharge: 2021-08-23 | Disposition: A | Discharge status: Home / Self Care | Payer: BC Managed Care – PPO | Source: Ambulatory Visit | Attending: Radiation Oncology | Admitting: Radiation Oncology

## 2021-08-23 DIAGNOSIS — Z510 Encounter for antineoplastic radiation therapy: Secondary | ICD-10-CM | POA: Diagnosis not present

## 2021-08-24 ENCOUNTER — Ambulatory Visit
Admission: RE | Admit: 2021-08-24 | Discharge: 2021-08-24 | Disposition: A | Discharge status: Home / Self Care | Payer: BC Managed Care – PPO | Source: Ambulatory Visit | Attending: Radiation Oncology | Admitting: Radiation Oncology

## 2021-08-24 DIAGNOSIS — Z510 Encounter for antineoplastic radiation therapy: Secondary | ICD-10-CM | POA: Diagnosis not present

## 2021-08-25 ENCOUNTER — Ambulatory Visit
Admission: RE | Admit: 2021-08-25 | Discharge: 2021-08-25 | Disposition: A | Discharge status: Home / Self Care | Payer: BC Managed Care – PPO | Source: Ambulatory Visit | Attending: Radiation Oncology | Admitting: Radiation Oncology

## 2021-08-25 DIAGNOSIS — Z510 Encounter for antineoplastic radiation therapy: Secondary | ICD-10-CM | POA: Diagnosis not present

## 2021-08-27 ENCOUNTER — Other Ambulatory Visit: Payer: Self-pay | Admitting: Pulmonary Disease

## 2021-08-29 ENCOUNTER — Ambulatory Visit
Admission: RE | Admit: 2021-08-29 | Discharge: 2021-08-29 | Disposition: A | Discharge status: Home / Self Care | Payer: BC Managed Care – PPO | Source: Ambulatory Visit | Attending: Radiation Oncology | Admitting: Radiation Oncology

## 2021-08-29 DIAGNOSIS — C50212 Malignant neoplasm of upper-inner quadrant of left female breast: Secondary | ICD-10-CM | POA: Insufficient documentation

## 2021-08-29 DIAGNOSIS — Z170 Estrogen receptor positive status [ER+]: Secondary | ICD-10-CM | POA: Diagnosis not present

## 2021-08-29 DIAGNOSIS — Z510 Encounter for antineoplastic radiation therapy: Secondary | ICD-10-CM | POA: Insufficient documentation

## 2021-08-30 ENCOUNTER — Ambulatory Visit
Admission: RE | Admit: 2021-08-30 | Discharge: 2021-08-30 | Disposition: A | Discharge status: Home / Self Care | Payer: BC Managed Care – PPO | Source: Ambulatory Visit | Attending: Radiation Oncology | Admitting: Radiation Oncology

## 2021-08-30 DIAGNOSIS — Z510 Encounter for antineoplastic radiation therapy: Secondary | ICD-10-CM | POA: Diagnosis not present

## 2021-08-31 ENCOUNTER — Ambulatory Visit
Admission: RE | Admit: 2021-08-31 | Discharge: 2021-08-31 | Disposition: A | Discharge status: Home / Self Care | Payer: BC Managed Care – PPO | Source: Ambulatory Visit | Attending: Radiation Oncology | Admitting: Radiation Oncology

## 2021-08-31 DIAGNOSIS — Z510 Encounter for antineoplastic radiation therapy: Secondary | ICD-10-CM | POA: Diagnosis not present

## 2021-09-08 ENCOUNTER — Telehealth: Payer: Self-pay | Admitting: Oncology

## 2021-09-08 NOTE — Telephone Encounter (Signed)
Left pt VM detailing appt change details. Will send mychart message as well.

## 2021-09-08 NOTE — Telephone Encounter (Signed)
Pt called and states that she can't come at 1:30 but can come at 2pm on 1-23.

## 2021-09-11 ENCOUNTER — Inpatient Hospital Stay: Payer: BC Managed Care – PPO | Admitting: Oncology

## 2021-09-14 ENCOUNTER — Ambulatory Visit: Payer: BC Managed Care – PPO | Admitting: Oncology

## 2021-09-18 ENCOUNTER — Inpatient Hospital Stay: Payer: BC Managed Care – PPO | Attending: Oncology | Admitting: Oncology

## 2021-09-18 ENCOUNTER — Ambulatory Visit: Payer: BC Managed Care – PPO | Admitting: Oncology

## 2021-09-18 ENCOUNTER — Encounter: Payer: Self-pay | Admitting: Oncology

## 2021-09-18 ENCOUNTER — Other Ambulatory Visit: Payer: Self-pay

## 2021-09-18 VITALS — BP 138/82 | HR 79 | Temp 96.7°F | Resp 16 | Wt 265.0 lb

## 2021-09-18 DIAGNOSIS — C50919 Malignant neoplasm of unspecified site of unspecified female breast: Secondary | ICD-10-CM | POA: Diagnosis not present

## 2021-09-18 DIAGNOSIS — Z803 Family history of malignant neoplasm of breast: Secondary | ICD-10-CM | POA: Insufficient documentation

## 2021-09-18 DIAGNOSIS — Z809 Family history of malignant neoplasm, unspecified: Secondary | ICD-10-CM

## 2021-09-18 DIAGNOSIS — C50912 Malignant neoplasm of unspecified site of left female breast: Secondary | ICD-10-CM | POA: Insufficient documentation

## 2021-09-18 DIAGNOSIS — Z79811 Long term (current) use of aromatase inhibitors: Secondary | ICD-10-CM | POA: Diagnosis not present

## 2021-09-18 DIAGNOSIS — E039 Hypothyroidism, unspecified: Secondary | ICD-10-CM | POA: Diagnosis not present

## 2021-09-18 DIAGNOSIS — Z170 Estrogen receptor positive status [ER+]: Secondary | ICD-10-CM | POA: Diagnosis not present

## 2021-09-18 MED ORDER — LETROZOLE 2.5 MG PO TABS: 2.5000 mg | ORAL_TABLET | Freq: Every day | ORAL | 2 refills | Status: DC

## 2021-09-18 NOTE — Progress Notes (Signed)
Hematology/Oncology follow up note  [phone] [phone]   Patient Care Team: Verdie Shire, MD as PCP - General (Family Medicine) Noreene Filbert, MD as Consulting Physician (Radiation Oncology)  REFERRING PROVIDER: Verdie Shire, MD  CHIEF COMPLAINTS/REASON FOR VISIT:  Follow-up for stage I left breast cancer.  HISTORY OF PRESENTING ILLNESS:   Breanna [name] is a  61 y.o.  female with PMH listed below was seen in consultation at the request of  Bowen, Lauren, MD  for evaluation of left breast cancer 04/11/2021, bilateral screening mammogram recommend further evaluation for possible mass in the left breast. 04/21/2021, diagnostic unilateral left mammogram showed a 3 mm suspicious mass in the posterior upper inner left breast without sonographic correlate.  Left axillary shows normal lymph nodes. 9/14/ 2022, patient underwent upper inner left breast stereotactic guided core biopsy.  Invasive mammary carcinoma, no special type.  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 ER 90%, PR 90%, HER2 negative. Patient was referred to establish care with oncology.  She has met surgeon Dr. Celine Ahr. Menarche 60-11 yo Patient has 1 child Age at first birth, 25 She has remote history of birth control pill use. LMP 48 Denies any previous breast biopsies.  Denies any hormone replacement therapy.  Family history is positive for mother with breast cancer at age of 69.  Paternal cousin had breast cancer at age of 29.  Paternal uncle and had lung cancer.  06/07/2021, patient underwent lumpectomy and sentinel lymph node biopsy.  No evidence of residual invasive mammary carcinoma.  Lymph nodes were negative.  pT1a pN0 ER+ PR+ HER2 negative  INTERVAL HISTORY Breanna [name] is a 61 y.o. female who has above history reviewed by me today presents for follow up visit for management of stage I left breast cancer s/p RT finished on 08/31/21 Patient reports tolerating  radiation well.  She has developed some rash beneath left breast and has improved.  No other new complaints.  She was accompanied by her daughter.    Review of Systems  Constitutional:  Negative for appetite change, chills, fatigue and fever.  HENT:   Negative for hearing loss and voice change.   Eyes:  Negative for eye problems.  Respiratory:  Negative for chest tightness and cough.   Cardiovascular:  Negative for chest pain.  Gastrointestinal:  Negative for abdominal distention, abdominal pain and blood in stool.  Endocrine: Negative for hot flashes.  Genitourinary:  Negative for difficulty urinating and frequency.   Musculoskeletal:  Negative for arthralgias.  Skin:  Negative for itching and rash.  Neurological:  Negative for extremity weakness.  Hematological:  Negative for adenopathy.  Psychiatric/Behavioral:  Negative for confusion.    MEDICAL HISTORY:  Past Medical History:  Diagnosis Date   Asthma    Breast cancer ([place])    left   Cancer ([place])    GERD (gastroesophageal reflux disease)    Hypothyroidism    Ventral hernia     SURGICAL HISTORY: Past Surgical History:  Procedure Laterality Date   BREAST BIOPSY Left 05/10/2021   Affirm bx- mass/"coil" marker-path pending   BREAST [place] BIOPSY Left 06/07/2021   Procedure: BREAST [place] LYMPH NODE BIOPSY;  Surgeon: Ronny Bacon, MD;  Location: [facility] ORS;  Service: General;  Laterality: Left;   CESAREAN SECTION     COLONOSCOPY     ENDOMETRIAL BIOPSY     HERNIA REPAIR      SOCIAL HISTORY: Social History   Socioeconomic History  Marital status: Married    Spouse name: Not on file   Number of children: Not on file   Years of education: Not on file   Highest education level: Not on file  Occupational History   Not on file  Tobacco Use   Smoking status: Never   Smokeless tobacco: Never  Vaping Use   Vaping Use:  Never used  Substance and Sexual Activity   Alcohol use: Never   Drug use: Never   Sexual activity: Not on file  Other Topics Concern   Not on file  Social History Narrative   Not on file   Social Determinants of Health   Financial Resource Strain: Not on file  Food Insecurity: Not on file  Transportation Needs: Not on file  Physical Activity: Not on file  Stress: Not on file  Social Connections: Not on file  Intimate Partner Violence: Not on file    FAMILY HISTORY: Family History  Problem Relation Age of Onset   Breast cancer Mother 40   Breast cancer Cousin 85       pat cousin    ALLERGIES:  is allergic to latex, other, sudafed [pseudoephedrine hcl], and silvadene [silver sulfadiazine].  MEDICATIONS:  Current Outpatient Medications  Medication Sig Dispense Refill   acetaminophen (TYLENOL) 500 MG tablet Take 500 mg by mouth every 6 (six) hours as needed.     albuterol (VENTOLIN HFA) 108 (90 Base) MCG/ACT inhaler Inhale 2 puffs into the lungs every 6 (six) hours as needed for wheezing or shortness of breath. 8 g 3   budesonide-formoterol (SYMBICORT) 160-4.5 MCG/ACT inhaler Inhale 2 puffs into the lungs 2 (two) times daily.     Calcium Carbonate-Vitamin D 600-200 MG-UNIT TABS Take 1 tablet by mouth daily.     Cetirizine HCl 10 MG CAPS Take 10 mg by mouth at bedtime.     Cholecalciferol 50 MCG (2000 UT) CAPS Take 2,000 Units by mouth daily.     esomeprazole (NEXIUM) 40 MG capsule TAKE 1 CAPSULE (40 MG TOTAL) BY MOUTH DAILY BEFORE SUPPER. 30 capsule 1   fluticasone (FLONASE) 50 MCG/ACT nasal spray Place 1 spray into both nostrils as needed for allergies or rhinitis.     ibuprofen (ADVIL) 800 MG tablet Take 1 tablet (800 mg total) by mouth every 8 (eight) hours as needed. 30 tablet 0   letrozole (FEMARA) 2.5 MG tablet Take 1 tablet (2.5 mg total) by mouth daily. 30 tablet 2   levothyroxine (SYNTHROID) 75 MCG tablet Take 75 mcg by mouth daily before breakfast.      lidocaine-prilocaine (EMLA) cream Apply to the areola of the surgical breast  and cover with plastic wrap one hour prior to leaving for surgery. 5 g 0   montelukast (SINGULAIR) 10 MG tablet Take 10 mg by mouth at bedtime.     sodium chloride (OCEAN) 0.65 % SOLN nasal spray Place 1 spray into both nostrils daily.     Spacer/Aero-Holding Chambers (AEROCHAMBER MV) inhaler 1 each by Miscellaneous route two (2) times a day as needed.     triamcinolone cream (KENALOG) 0.1 % Apply 1 application topically daily as needed (Rash).     HYDROcodone-acetaminophen (NORCO/VICODIN) 5-325 MG tablet Take 1 tablet by mouth every 6 (six) hours as needed for moderate pain. (Patient not taking: Reported on 09/18/2021) 15 tablet 0   No current facility-administered medications for this visit.     PHYSICAL EXAMINATION: ECOG PERFORMANCE STATUS: 0 - Asymptomatic Vitals:   09/18/21 1403  BP: 138/82  Pulse: 79  Resp: 16  Temp: (!) 96.7 F (35.9 C)  SpO2: 96%   Filed Weights   09/18/21 1403  Weight: 265 lb (120.2 kg)    Physical Exam Constitutional:      General: She is not in acute distress. HENT:     Head: Normocephalic and atraumatic.  Eyes:     General: No scleral icterus. Cardiovascular:     Rate and Rhythm: Normal rate and regular rhythm.     Heart sounds: Normal heart sounds.  Pulmonary:     Effort: Pulmonary effort is normal. No respiratory distress.     Breath sounds: No wheezing.  Abdominal:     General: Bowel sounds are normal. There is no distension.     Palpations: Abdomen is soft.  Musculoskeletal:        General: No deformity. Normal range of motion.     Cervical back: Normal range of motion and neck supple.  Skin:    General: Skin is warm and dry.     Findings: No erythema or rash.  Neurological:     Mental Status: She is alert and oriented to person, place, and time. Mental status is at baseline.     Cranial Nerves: No cranial nerve deficit.     Coordination: Coordination  normal.  Psychiatric:        Mood and Affect: Mood normal.    LABORATORY DATA:  I have reviewed the data as listed Lab Results  Component Value Date   WBC 7.7 08/22/2021   HGB 14.2 08/22/2021   HCT 45.0 08/22/2021   MCV 94.3 08/22/2021   PLT 286 08/22/2021   Recent Labs    05/23/21 1607  NA 138  K 4.2  CL 103  CO2 26  GLUCOSE 100*  BUN 17  CREATININE 0.82  CALCIUM 9.4  GFRNONAA >60  PROT 8.4*  ALBUMIN 4.2  AST 14*  ALT 10  ALKPHOS 51  BILITOT 0.6    Iron/TIBC/Ferritin/ %Sat No results found for: IRON, TIBC, FERRITIN, IRONPCTSAT    RADIOGRAPHIC STUDIES: I have personally reviewed the radiological images as listed and agreed with the findings in the report. No results found.    ASSESSMENT & PLAN:  1. Invasive carcinoma of breast ([place])   2. Family history of cancer    #stage IA left invasive breast carcinoma, pT1a pN0 ER+ PR+ HER2- Status post lumpectomy with sentinel lymph node biopsy. Patient has been finished adjuvant radiation Rationale of using aromatase inhibitor -Letrozole was  discussed with patient.  Side effects including but not limited to hot flush, joint pain, fatigue, mood swing, osteoporosis, etc discussed with patient. Patient voices understanding and willing to proceed.   I will obtain baseline bone density.  Family history of cancer, genetic testing was negative.    All questions were answered. The patient knows to call the clinic with any problems questions or concerns.  cc Bowen, Lauren, MD    Return of visit: Follow-up in 6 weeks for evaluation of tolerability of aromatase inhibitor.  Earlie Server, MD, PhD 09/18/2021

## 2021-09-18 NOTE — Progress Notes (Signed)
Pt in for follow up, denies any difficulties or concerns today. 

## 2021-09-20 ENCOUNTER — Encounter: Payer: Self-pay | Admitting: Oncology

## 2021-09-20 ENCOUNTER — Telehealth: Payer: Self-pay | Admitting: Oncology

## 2021-09-20 NOTE — Telephone Encounter (Signed)
Please see pt appt change request

## 2021-09-20 NOTE — Telephone Encounter (Signed)
Pt called to reschedule her appts for 3-9. Call back at [phone]

## 2021-09-20 NOTE — Telephone Encounter (Signed)
Please advise 

## 2021-09-24 ENCOUNTER — Other Ambulatory Visit: Payer: Self-pay | Admitting: Pulmonary Disease

## 2021-10-04 ENCOUNTER — Ambulatory Visit
Admission: RE | Admit: 2021-10-04 | Discharge: 2021-10-04 | Disposition: A | Discharge status: Home / Self Care | Payer: BC Managed Care – PPO | Source: Ambulatory Visit | Attending: Radiation Oncology | Admitting: Radiation Oncology

## 2021-10-04 ENCOUNTER — Other Ambulatory Visit: Payer: Self-pay

## 2021-10-04 ENCOUNTER — Encounter: Payer: Self-pay | Admitting: Radiation Oncology

## 2021-10-04 VITALS — BP 157/105 | HR 81 | Temp 97.0°F | Resp 20 | Wt 262.6 lb

## 2021-10-04 DIAGNOSIS — Z923 Personal history of irradiation: Secondary | ICD-10-CM | POA: Diagnosis not present

## 2021-10-04 DIAGNOSIS — Z170 Estrogen receptor positive status [ER+]: Secondary | ICD-10-CM | POA: Insufficient documentation

## 2021-10-04 DIAGNOSIS — C50212 Malignant neoplasm of upper-inner quadrant of left female breast: Secondary | ICD-10-CM | POA: Diagnosis not present

## 2021-10-04 DIAGNOSIS — C50919 Malignant neoplasm of unspecified site of unspecified female breast: Secondary | ICD-10-CM

## 2021-10-04 DIAGNOSIS — Z79811 Long term (current) use of aromatase inhibitors: Secondary | ICD-10-CM | POA: Insufficient documentation

## 2021-10-04 NOTE — Progress Notes (Signed)
Survivorship Care Plan visit completed.  Treatment summary reviewed and given to patient.  ASCO answers booklet reviewed and given to patient.  CARE program and Cancer Transitions discussed with patient along with other resources cancer center offers to patients and caregivers.  Patient verbalized understanding.    

## 2021-10-04 NOTE — Progress Notes (Signed)
Radiation Oncology Follow up Note  Name: Breanna [name]   Date:   10/04/2021 MRN:  [id number] DOB: [birth year]    This 61 y.o. 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  REFERRING PROVIDER: Verdie Shire, MD  HPI: Patient is a 61 year old female now at 1 month after completing whole breast radiation to her left breast status post wide local excision for a stage Ia ER/PR positive invasive mammary carcinoma.  Seen today in routine follow-up she is doing well.  She specifically denies breast tenderness cough or bone pain..  She has been started on Femara tolerating that well without side effects.  COMPLICATIONS OF TREATMENT: none  FOLLOW UP COMPLIANCE: keeps appointments   PHYSICAL EXAM:  BP (!) 157/105 (BP Location: Right Wrist, Patient Position: Sitting, Cuff Size: Small) Comment: Patient states her BP is usuall elevated at clinic she monitors at home and is WNL   Pulse 81    Temp (!) 97 F (36.1 C)    Resp 20    Wt 262 lb 9.6 oz (119.1 kg)    BMI 39.93 kg/m  Lungs are clear to A&P cardiac examination essentially unremarkable with regular rate and rhythm. No dominant mass or nodularity is noted in either breast in 2 positions examined. Incision is well-healed. No axillary or supraclavicular adenopathy is appreciated. Cosmetic result is excellent.  Well-developed well-nourished patient in NAD. HEENT reveals PERLA, EOMI, discs not visualized.  Oral cavity is clear. No oral mucosal lesions are identified. Neck is clear without evidence of cervical or supraclavicular adenopathy. Lungs are clear to A&P. Cardiac examination is essentially unremarkable with regular rate and rhythm without murmur rub or thrill. Abdomen is benign with no organomegaly or masses noted. Motor sensory and DTR levels are equal and symmetric in the upper and lower extremities.  Cranial nerves II through XII are grossly intact. Proprioception is intact. No peripheral adenopathy or edema is identified. No motor or sensory levels are noted. Crude visual fields are within normal range.  RADIOLOGY RESULTS: No current films to review  PLAN: Present time she is doing well 1 month out from whole breast radiation and pleased with her overall progress.  I have asked to see her back in 5 months for follow-up.  She continues on Femara without side effect.  Patient is to call with any concerns.  I would like to take this opportunity to thank you for allowing me to participate in the care of your patient.Noreene Filbert, MD

## 2021-10-18 ENCOUNTER — Encounter: Payer: Self-pay | Admitting: Oncology

## 2021-10-30 ENCOUNTER — Ambulatory Visit
Admission: RE | Admit: 2021-10-30 | Discharge: 2021-10-30 | Disposition: A | Discharge status: Home / Self Care | Payer: BC Managed Care – PPO | Source: Ambulatory Visit | Attending: Oncology | Admitting: Oncology

## 2021-10-30 ENCOUNTER — Ambulatory Visit: Payer: BC Managed Care – PPO | Admitting: Oncology

## 2021-10-30 ENCOUNTER — Other Ambulatory Visit: Payer: BC Managed Care – PPO

## 2021-10-30 ENCOUNTER — Other Ambulatory Visit: Payer: Self-pay

## 2021-10-30 ENCOUNTER — Encounter: Payer: Self-pay | Admitting: Oncology

## 2021-10-30 DIAGNOSIS — C50919 Malignant neoplasm of unspecified site of unspecified female breast: Secondary | ICD-10-CM | POA: Insufficient documentation

## 2021-10-31 NOTE — Telephone Encounter (Signed)
Please advise 

## 2021-11-02 ENCOUNTER — Other Ambulatory Visit: Payer: BC Managed Care – PPO

## 2021-11-02 ENCOUNTER — Ambulatory Visit: Payer: BC Managed Care – PPO | Admitting: Oncology

## 2021-11-07 ENCOUNTER — Other Ambulatory Visit: Payer: Self-pay

## 2021-11-07 DIAGNOSIS — C50212 Malignant neoplasm of upper-inner quadrant of left female breast: Secondary | ICD-10-CM

## 2021-11-08 ENCOUNTER — Other Ambulatory Visit: Payer: Self-pay | Admitting: *Deleted

## 2021-11-08 DIAGNOSIS — C50919 Malignant neoplasm of unspecified site of unspecified female breast: Secondary | ICD-10-CM

## 2021-11-14 ENCOUNTER — Ambulatory Visit: Payer: BC Managed Care – PPO | Admitting: Oncology

## 2021-11-14 ENCOUNTER — Other Ambulatory Visit: Payer: BC Managed Care – PPO

## 2021-11-15 ENCOUNTER — Inpatient Hospital Stay: Payer: BC Managed Care – PPO | Attending: Oncology

## 2021-11-15 ENCOUNTER — Encounter: Payer: Self-pay | Admitting: Oncology

## 2021-11-15 ENCOUNTER — Inpatient Hospital Stay (HOSPITAL_BASED_OUTPATIENT_CLINIC_OR_DEPARTMENT_OTHER): Payer: BC Managed Care – PPO | Admitting: Oncology

## 2021-11-15 ENCOUNTER — Other Ambulatory Visit: Payer: Self-pay

## 2021-11-15 VITALS — BP 152/92 | HR 75 | Temp 96.7°F | Wt 265.0 lb

## 2021-11-15 DIAGNOSIS — C50919 Malignant neoplasm of unspecified site of unspecified female breast: Secondary | ICD-10-CM

## 2021-11-15 DIAGNOSIS — Z809 Family history of malignant neoplasm, unspecified: Secondary | ICD-10-CM | POA: Diagnosis not present

## 2021-11-15 DIAGNOSIS — Z170 Estrogen receptor positive status [ER+]: Secondary | ICD-10-CM | POA: Diagnosis not present

## 2021-11-15 DIAGNOSIS — Z79811 Long term (current) use of aromatase inhibitors: Secondary | ICD-10-CM | POA: Diagnosis not present

## 2021-11-15 DIAGNOSIS — M8580 Other specified disorders of bone density and structure, unspecified site: Secondary | ICD-10-CM | POA: Insufficient documentation

## 2021-11-15 DIAGNOSIS — C50212 Malignant neoplasm of upper-inner quadrant of left female breast: Secondary | ICD-10-CM | POA: Insufficient documentation

## 2021-11-15 LAB — COMPREHENSIVE METABOLIC PANEL
ALT: 10 U/L (ref 0–44)
AST: 15 U/L (ref 15–41)
Albumin: 4.1 g/dL (ref 3.5–5.0)
Alkaline Phosphatase: 56 U/L (ref 38–126)
Anion gap: 7 (ref 5–15)
BUN: 18 mg/dL (ref 8–23)
CO2: 27 mmol/L (ref 22–32)
Calcium: 9.4 mg/dL (ref 8.9–10.3)
Chloride: 104 mmol/L (ref 98–111)
Creatinine, Ser: 0.89 mg/dL (ref 0.44–1.00)
GFR, Estimated: >60 mL/min (ref >60)
Glucose, Bld: 104 mg/dL — ABNORMAL HIGH (ref 70–99)
Potassium: 4.2 mmol/L (ref 3.5–5.1)
Sodium: 138 mmol/L (ref 135–145)
Total Bilirubin: 0.4 mg/dL (ref 0.3–1.2)
Total Protein: 8.2 g/dL — ABNORMAL HIGH (ref 6.5–8.1)

## 2021-11-15 LAB — CBC WITH DIFFERENTIAL/PLATELET
Abs Immature Granulocytes: 0.02 10*3/uL (ref 0.00–0.07)
Basophils Absolute: 0.1 10*3/uL (ref 0.0–0.1)
Basophils Relative: 1 %
Eosinophils Absolute: 0.5 10*3/uL (ref 0.0–0.5)
Eosinophils Relative: 6 %
HCT: 43.2 % (ref 36.0–46.0)
Hemoglobin: 14.1 g/dL (ref 12.0–15.0)
Immature Granulocytes: 0 %
Lymphocytes Relative: 19 %
Lymphs Abs: 1.6 10*3/uL (ref 0.7–4.0)
MCH: 30.3 pg (ref 26.0–34.0)
MCHC: 32.6 g/dL (ref 30.0–36.0)
MCV: 92.9 fL (ref 80.0–100.0)
Monocytes Absolute: 0.5 10*3/uL (ref 0.1–1.0)
Monocytes Relative: 6 %
Neutro Abs: 5.6 10*3/uL (ref 1.7–7.7)
Neutrophils Relative %: 68 %
Platelets: 281 10*3/uL (ref 150–400)
RBC: 4.65 MIL/uL (ref 3.87–5.11)
RDW: 14.2 % (ref 11.5–15.5)
WBC: 8.3 10*3/uL (ref 4.0–10.5)
nRBC: 0 % (ref 0.0–0.2)

## 2021-11-15 MED ORDER — LETROZOLE 2.5 MG PO TABS: 2.5000 mg | ORAL_TABLET | Freq: Every day | ORAL | 1 refills | Status: DC

## 2021-11-15 NOTE — Progress Notes (Signed)
?Hematology/Oncology follow up note ? ?[phone] [phone] ? ? ?Patient Care Team: ?Verdie Shire, MD as PCP - General (Family Medicine) ?Noreene Filbert, MD as Consulting Physician (Radiation Oncology) ?Earlie Server, MD as Consulting Physician (Oncology) ?Ronny Bacon, MD as Consulting Physician (General Surgery) ? ?REFERRING PROVIDER: ?Verdie Shire, MD  ?CHIEF COMPLAINTS/REASON FOR VISIT:  ?Follow-up for stage I left breast cancer. ? ?HISTORY OF PRESENTING ILLNESS:  ? ?Breanna [name] is a  61 y.o.  female with PMH listed below was seen in consultation at the request of  Bowen, Lauren, MD  for evaluation of left breast cancer ?04/11/2021, bilateral screening mammogram recommend further evaluation for possible mass in the left breast. ?04/21/2021, diagnostic unilateral left mammogram showed a 3 mm suspicious mass in the posterior upper inner left breast without sonographic correlate.  Left axillary shows normal lymph nodes. ?9/14/ 2022, patient underwent upper inner left breast stereotactic guided core biopsy.  Invasive mammary carcinoma, no special type.  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 ?ER 90%, PR 90%, HER2 negative. ?Patient was referred to establish care with oncology.  She has met surgeon Dr. Celine Ahr. ?Menarche 10-11 yo ?Patient has 1 child ?Age at first birth, 10 ?She has remote history of birth control pill use. ?LMP 48 ?Denies any previous breast biopsies.  Denies any hormone replacement therapy. ? ?Family history is positive for mother with breast cancer at age of 82.  Paternal cousin had breast cancer at age of 7.  Paternal uncle and had lung cancer. ? ?06/07/2021, patient underwent lumpectomy and sentinel lymph node biopsy.  No evidence of residual invasive mammary carcinoma.  Lymph nodes were negative.  pT1a pN0 ER+ PR+ HER2 negative ? ?s/p RT finished on 08/31/21 ? ?INTERVAL HISTORY ?Breanna [name] is a 61 y.o. female who has above history reviewed by  me today presents for follow up visit for management of stage I left breast cancer ? ?Started on letrozole 2.45m daily since end of Jan 2023.  ?Overall she tolerates well. Manageable hot flush, joint pain.  ? ? ?Review of Systems  ?Constitutional:  Negative for appetite change, chills, fatigue and fever.  ?HENT:   Negative for hearing loss and voice change.   ?Eyes:  Negative for eye problems.  ?Respiratory:  Negative for chest tightness and cough.   ?Cardiovascular:  Negative for chest pain.  ?Gastrointestinal:  Negative for abdominal distention, abdominal pain and blood in stool.  ?Endocrine: Negative for hot flashes.  ?Genitourinary:  Negative for difficulty urinating and frequency.   ?Musculoskeletal:  Negative for arthralgias.  ?Skin:  Negative for itching and rash.  ?Neurological:  Negative for extremity weakness.  ?Hematological:  Negative for adenopathy.  ?Psychiatric/Behavioral:  Negative for confusion.   ? ?MEDICAL HISTORY:  ?Past Medical History:  ?Diagnosis Date  ? Asthma   ? [place]   ? left  ? [facility]   ? GERD (gastroesophageal reflux disease)   ? Hypothyroidism   ? Ventral hernia   ? ? ?SURGICAL HISTORY: ?Past Surgical History:  ?Procedure Laterality Date  ? BREAST BIOPSY Left 05/10/2021  ? Affirm bx- mass/"coil" marker-path pending  ? BREAST LUMPECTOMY,RADIO FREQ LOCALIZER,AXILLARY SENTINEL LYMPH NODE BIOPSY Left 06/07/2021  ? Procedure: BREAST LUMPECTOMY,RADIO FREQ LOCALIZER,AXILLARY SENTINEL LYMPH NODE BIOPSY;  Surgeon: RRonny Bacon MD;  Location: [facility] ORS;  Service: General;  Laterality: Left;  ? CESAREAN SECTION    ? COLONOSCOPY    ? ENDOMETRIAL BIOPSY    ? HERNIA REPAIR    ? ? ?  SOCIAL HISTORY: ?Social History  ? ?Socioeconomic History  ? Marital status: Married  ?  Spouse name: Not on file  ? Number of children: Not on file  ? Years of education: Not on file  ? Highest education level: Not on file  ?Occupational History  ? Not on file  ?Tobacco Use  ? Smoking status: Never   ? Smokeless tobacco: Never  ?Vaping Use  ? Vaping Use: Never used  ?Substance and Sexual Activity  ? Alcohol use: Never  ? Drug use: Never  ? Sexual activity: Not on file  ?Other Topics Concern  ? Not on file  ?Social History Narrative  ? Not on file  ? ?Social Determinants of Health  ? ?Financial Resource Strain: Not on file  ?Food Insecurity: Not on file  ?Transportation Needs: Not on file  ?Physical Activity: Not on file  ?Stress: Not on file  ?Social Connections: Not on file  ?Intimate Partner Violence: Not on file  ? ? ?FAMILY HISTORY: ?Family History  ?Problem Relation Age of Onset  ? Breast cancer Mother 2  ? Breast cancer Cousin 10  ?     pat cousin  ? ? ?ALLERGIES:  is allergic to latex, other, sudafed [pseudoephedrine hcl], and silvadene [silver sulfadiazine]. ? ?MEDICATIONS:  ?Current Outpatient Medications  ?Medication Sig Dispense Refill  ? acetaminophen (TYLENOL) 500 MG tablet Take 500 mg by mouth every 6 (six) hours as needed.    ? albuterol (VENTOLIN HFA) 108 (90 Base) MCG/ACT inhaler Inhale 2 puffs into the lungs every 6 (six) hours as needed for wheezing or shortness of breath. 8 g 3  ? budesonide-formoterol (SYMBICORT) 160-4.5 MCG/ACT inhaler Inhale 2 puffs into the lungs 2 (two) times daily.    ? Calcium Carbonate-Vitamin D 600-200 MG-UNIT TABS Take 1 tablet by mouth daily.    ? Cetirizine HCl 10 MG CAPS Take 10 mg by mouth at bedtime.    ? Cholecalciferol 50 MCG (2000 UT) CAPS Take 2,000 Units by mouth daily.    ? esomeprazole (NEXIUM) 40 MG capsule TAKE 1 CAPSULE (40 MG TOTAL) BY MOUTH DAILY BEFORE SUPPER. 30 capsule 1  ? fluticasone (FLONASE) 50 MCG/ACT nasal spray Place 1 spray into both nostrils as needed for allergies or rhinitis.    ? HYDROcodone-acetaminophen (NORCO/VICODIN) 5-325 MG tablet Take 1 tablet by mouth every 6 (six) hours as needed for moderate pain. 15 tablet 0  ? ibuprofen (ADVIL) 800 MG tablet Take 1 tablet (800 mg total) by mouth every 8 (eight) hours as needed. 30 tablet  0  ? levothyroxine (SYNTHROID) 75 MCG tablet Take 75 mcg by mouth daily before breakfast.    ? lidocaine-prilocaine (EMLA) cream Apply to the areola of the surgical breast  and cover with plastic wrap one hour prior to leaving for surgery. 5 g 0  ? montelukast (SINGULAIR) 10 MG tablet Take 10 mg by mouth at bedtime.    ? sodium chloride (OCEAN) 0.65 % SOLN nasal spray Place 1 spray into both nostrils daily.    ? Spacer/Aero-Holding Chambers (AEROCHAMBER MV) inhaler 1 each by Miscellaneous route two (2) times a day as needed.    ? triamcinolone cream (KENALOG) 0.1 % Apply 1 application topically daily as needed (Rash).    ? letrozole (FEMARA) 2.5 MG tablet Take 1 tablet (2.5 mg total) by mouth daily. 90 tablet 1  ? ?No current facility-administered medications for this visit.  ? ? ? ?PHYSICAL EXAMINATION: ?ECOG PERFORMANCE STATUS: 0 - Asymptomatic ?Vitals:  ? 11/15/21  1439  ?BP: (!) 152/92  ?Pulse: 75  ?Temp: (!) 96.7 ?F (35.9 ?C)  ? ?Filed Weights  ? 11/15/21 1439  ?Weight: 265 lb (120.2 kg)  ? ? ?Physical Exam ?Constitutional:   ?   General: She is not in acute distress. ?HENT:  ?   Head: Normocephalic and atraumatic.  ?Eyes:  ?   General: No scleral icterus. ?Cardiovascular:  ?   Rate and Rhythm: Normal rate and regular rhythm.  ?   Heart sounds: Normal heart sounds.  ?Pulmonary:  ?   Effort: Pulmonary effort is normal. No respiratory distress.  ?   Breath sounds: No wheezing.  ?Abdominal:  ?   General: Bowel sounds are normal. There is no distension.  ?   Palpations: Abdomen is soft.  ?Musculoskeletal:     ?   General: No deformity. Normal range of motion.  ?   Cervical back: Normal range of motion and neck supple.  ?Skin: ?   General: Skin is warm and dry.  ?   Findings: No erythema or rash.  ?Neurological:  ?   Mental Status: She is alert and oriented to person, place, and time. Mental status is at baseline.  ?   Cranial Nerves: No cranial nerve deficit.  ?   Coordination: Coordination normal.  ?Psychiatric:      ?   Mood and Affect: Mood normal.  ? ? ?LABORATORY DATA:  ?I have reviewed the data as listed ?Lab Results  ?Component Value Date  ? WBC 8.3 11/15/2021  ? HGB 14.1 11/15/2021  ? HCT 43.2 11/15/2021

## 2021-11-15 NOTE — Progress Notes (Signed)
Patient here for follow up. Patient complains of fatigue, occasional headaches and breathing heavy. Patient denies difficulty breathing or discomfort in chest.  ?

## 2021-11-24 ENCOUNTER — Ambulatory Visit
Admission: RE | Admit: 2021-11-24 | Discharge: 2021-11-24 | Disposition: A | Discharge status: Home / Self Care | Payer: BC Managed Care – PPO | Source: Ambulatory Visit | Attending: Surgery | Admitting: Surgery

## 2021-11-24 DIAGNOSIS — C50212 Malignant neoplasm of upper-inner quadrant of left female breast: Secondary | ICD-10-CM | POA: Diagnosis present

## 2021-11-24 DIAGNOSIS — Z170 Estrogen receptor positive status [ER+]: Secondary | ICD-10-CM | POA: Insufficient documentation

## 2021-11-24 HISTORY — DX: Personal history of irradiation: Z92.3

## 2021-12-07 ENCOUNTER — Encounter: Payer: Self-pay | Admitting: Surgery

## 2021-12-07 ENCOUNTER — Ambulatory Visit: Payer: BC Managed Care – PPO | Admitting: Surgery

## 2021-12-07 VITALS — BP 163/110 | HR 99 | Temp 98.2°F | Ht 68.0 in | Wt 266.0 lb

## 2021-12-07 DIAGNOSIS — C50212 Malignant neoplasm of upper-inner quadrant of left female breast: Secondary | ICD-10-CM

## 2021-12-07 DIAGNOSIS — Z170 Estrogen receptor positive status [ER+]: Secondary | ICD-10-CM

## 2021-12-07 NOTE — Patient Instructions (Signed)
If you have any concerns or questions, please feel free to call our office.  ? ?Breast Self-Awareness ?Breast self-awareness is knowing how your breasts look and feel. Doing breast self-awareness is important. It allows you to catch a breast problem early while it is still small and can be treated. All women should do breast self-awareness, including women who have had breast implants. Tell your doctor if you notice a change in your breasts. ?What you need: ?A mirror. ?A well-lit room. ?How to do a breast self-exam ?A breast self-exam is one way to learn what is normal for your breasts and to check for changes. To do a breast self-exam: ?Look for changes ? ?Take off all the clothes above your waist. ?Stand in front of a mirror in a room with good lighting. ?Put your hands on your hips. ?Push your hands down. ?Look at your breasts and nipples in the mirror to see if one breast or nipple looks different from the other. Check to see if: ?The shape of one breast is different. ?The size of one breast is different. ?There are wrinkles, dips, and bumps in one breast and not the other. ?Look at each breast for changes in the skin, such as: ?Redness. ?Scaly areas. ?Look for changes in your nipples, such as: ?Liquid around the nipples. ?Bleeding. ?Dimpling. ?Redness. ?A change in where the nipples are. ?Feel for changes ? ?Lie on your back on the floor. ?Feel each breast. To do this, follow these steps: ?Pick a breast to feel. ?Put the arm closest to that breast above your head. ?Use your other arm to feel the nipple area of your breast. Feel the area with the pads of your three middle fingers by making small circles with your fingers. For the first circle, press lightly. For the second circle, press harder. For the third circle, press even harder. ?Keep making circles with your fingers at the different pressures as you move down your breast. Stop when you feel your ribs. ?Move your fingers a little toward the center of your  body. ?Start making circles with your fingers again, this time going up until you reach your collarbone. ?Keep making up-and-down circles until you reach your armpit. Remember to keep using the three pressures. ?Feel the other breast in the same way. ?Sit or stand in the tub or shower. ?With soapy water on your skin, feel each breast the same way you did in step 2 when you were lying on the floor. ?Write down what you find ?Writing down what you find can help you remember what to tell your doctor. Write down: ?What is normal for each breast. ?Any changes you find in each breast, including: ?The kind of changes you find. ?Whether you have pain. ?Size and location of any lumps. ?When you last had your menstrual period. ?General tips ?Check your breasts every month. ?If you are breastfeeding, the best time to check your breasts is after you feed your baby or after you use a breast pump. ?If you get menstrual periods, the best time to check your breasts is 5-7 days after your menstrual period is over. ?With time, you will become comfortable with the self-exam, and you will begin to know if there are changes in your breasts. ?Contact a doctor if you: ?See a change in the shape or size of your breasts or nipples. ?See a change in the skin of your breast or nipples, such as red or scaly skin. ?Have fluid coming from your nipples that is  not normal. ?Find a lump or thick area that was not there before. ?Have pain in your breasts. ?Have any concerns about your breast health. ?Summary ?Breast self-awareness includes looking for changes in your breasts, as well as feeling for changes within your breasts. ?Breast self-awareness should be done in front of a mirror in a well-lit room. ?You should check your breasts every month. If you get menstrual periods, the best time to check your breasts is 5-7 days after your menstrual period is over. ?Let your doctor know of any changes you see in your breasts, including changes in size,  changes on the skin, pain or tenderness, or fluid from your nipples that is not normal. ?This information is not intended to replace advice given to you by your health care provider. Make sure you discuss any questions you have with your health care provider. ?Document Revised: 04/01/2018 Document Reviewed: 04/01/2018 ?Elsevier Patient Education ? [place]. ? ?

## 2021-12-07 NOTE — Progress Notes (Signed)
Surgical Clinic Progress/Follow-up Note  ? ?HPI:  ?61 y.o. Female presents to clinic for left breast cancer follow-up.   She has completed her radiation with some tan like postradiation changes of the left breast.  She currently is taking her letrozole and tolerating it well.  Patient reports  improvement/resolution of prior issues and has been tolerating regular diet, denies N/V, fever/chills, CP, or SOB.  We brought her attention to her hypertension, and advised she get connected with her PCP in follow-up. ? ?Review of Systems:  ?Constitutional: denies fever/chills  ?ENT: denies sore throat, hearing problems  ?Respiratory: denies shortness of breath, wheezing  ?Cardiovascular: denies chest pain, palpitations  ?Gastrointestinal: denies abdominal pain, N/V, or diarrhea/and bowel function as per interval history ?Skin: Denies any other rashes or skin discolorations except post-surgical wounds as per interval history ? ?Vital Signs:  ?BP (!) 163/110   Pulse 99   Temp 98.2 ?F (36.8 ?C) (Oral)   Ht '5\' 8"'$  (1.727 m)   Wt 266 lb (120.7 kg)   SpO2 97%   BMI 40.45 kg/m?   ? ?Physical Exam:  ?Constitutional:  ?-- Obese body habitus  ?-- Awake, alert, and oriented x3  ?Pulmonary:  ?-- No crackles ?-- Equal breath sounds bilaterally ?-- Breathing non-labored at rest ?Cardiovascular:  ?-- S1, S2 present  ?-- No pericardial rubs  ?Gastrointestinal:  ?-- Soft and well rounded with remarkable diastases, non-tender.   ?GU  --Levada Dy present as chaperone: Left breast exam with upper inner quadrant postoperative lumpectomy site changes, postradiation changes as noted.  Still quite soft and supple without dominant masses nor suspicious nodularity.  Right breast same soft and supple without suspicious or dominant nodularity or masses present. ?Musculoskeletal / Integumentary:  ?-- Wounds or skin discoloration: None appreciated except post-surgical/postirradiation changes as described above left breast. ?-- Extremities: B/L UE and  LE FROM, hands and feet warm, no edema  ? ? ?Imaging:  ?CLINICAL DATA:  61 year old female presenting for short-term ?follow-up status post left breast lumpectomy in October of 2022. ?  ?EXAM: ?DIGITAL DIAGNOSTIC UNILATERAL LEFT MAMMOGRAM WITH TOMOSYNTHESIS AND ?CAD ?  ?TECHNIQUE: ?Left digital diagnostic mammography and breast tomosynthesis was ?performed. The images were evaluated with computer-aided detection. ?  ?COMPARISON:  Previous exam(s). ?  ?ACR Breast Density Category b: There are scattered areas of ?fibroglandular density. ?  ?FINDINGS: ?Surgical changes noted in the upper inner quadrant of the left ?breast consistent with history of lumpectomy. There is mild diffuse ?skin thickening consistent with history of radiation therapy. No ?suspicious calcifications, masses or areas of distortion are seen in ?the left breast. ?  ?IMPRESSION: ?Expected surgical changes in the upper inner posterior left breast. ?No evidence of left breast malignancy. ?  ?RECOMMENDATION: ?Bilateral diagnostic mammogram is recommended in August of 2023. ?  ?I have discussed the findings and recommendations with the patient. ?If applicable, a reminder letter will be sent to the patient ?regarding the next appointment. ?  ?BI-RADS CATEGORY  2: Benign. ?  ?  ?Electronically Signed ?  By: Ammie Ferrier M.D. ?  On: 11/24/2021 14:45 ? ?Assessment:  ?61 y.o. yo Female with a problem list including...  ?Patient Active Problem List  ? Diagnosis Date Noted  ? Genetic testing 06/15/2021  ? Malignant neoplasm of upper-inner quadrant of left breast in female, estrogen receptor positive ([place]) 06/15/2021  ? Postprocedural hypothyroidism 02/24/2019  ? Allergic rhinitis 03/21/2013  ? Obesity 03/21/2013  ?  ?presents to clinic for follow-up evaluation of left breast cancer, progressing  well. ? ?Plan:  ?            - return to clinic following next imaging, or as needed, instructed to call office if any questions or concerns ? ?All of the above  recommendations were discussed with the patient and patient's family, and all of patient's and family's questions were answered to her expressed satisfaction. ? ?These notes generated with voice recognition software. I apologize for typographical errors. ? ?Ronny Bacon, MD, FACS ?[place] ?General Surgery - Partnering for exceptional care. ?Office: [phone]  ?

## 2021-12-23 ENCOUNTER — Encounter: Payer: Self-pay | Admitting: Oncology

## 2022-01-24 ENCOUNTER — Encounter: Payer: Self-pay | Admitting: Oncology

## 2022-02-23 ENCOUNTER — Other Ambulatory Visit: Payer: Self-pay | Admitting: Surgery

## 2022-02-23 DIAGNOSIS — Z853 Personal history of malignant neoplasm of breast: Secondary | ICD-10-CM

## 2022-03-05 ENCOUNTER — Ambulatory Visit
Admission: RE | Admit: 2022-03-05 | Discharge: 2022-03-05 | Disposition: A | Discharge status: Home / Self Care | Payer: BC Managed Care – PPO | Source: Ambulatory Visit | Attending: Radiation Oncology | Admitting: Radiation Oncology

## 2022-03-05 VITALS — BP 134/79 | HR 77 | Temp 97.0°F | Resp 18 | Ht 68.0 in

## 2022-03-05 DIAGNOSIS — Z170 Estrogen receptor positive status [ER+]: Secondary | ICD-10-CM | POA: Insufficient documentation

## 2022-03-05 DIAGNOSIS — C50212 Malignant neoplasm of upper-inner quadrant of left female breast: Secondary | ICD-10-CM

## 2022-03-05 DIAGNOSIS — Z79811 Long term (current) use of aromatase inhibitors: Secondary | ICD-10-CM | POA: Insufficient documentation

## 2022-03-05 NOTE — Progress Notes (Signed)
Radiation Oncology Follow up Note  Name: Breanna [name]   Date:   03/05/2022 MRN:  [id number] DOB: 11/30/1960    This 61 y.o. female presents to the clinic today for 85-monthfollow-up status post whole breast radiation to her left breast for stage Ia (T1 a N0 M0) ER PR positive invasive mammary carcinoma.  REFERRING PROVIDER: BVerdie Shire MD  HPI: Patient is a 61year old female now at 63 months having completed whole breast radiation to her left breast for stage Ia ER/PR positive invasive mammary carcinoma.  Seen today in routine follow-up she is doing well.  She specifically denies breast tenderness cough or bone pain.  She still has some hyperpigmentation of the skin.  She is currently on Femara tolerating it well without side effect..  She had mammograms back in March which I have reviewed were BI-RADS 2 benign  COMPLICATIONS OF TREATMENT: none  FOLLOW UP COMPLIANCE: keeps appointments   PHYSICAL EXAM:  BP 134/79   Pulse 77   Temp (!) 97 F (36.1 C)   Resp 18   Ht '5\' 8"'$  (1.727 m)   BMI 40.45 kg/m  Lungs are clear to A&P cardiac examination essentially unremarkable with regular rate and rhythm. No dominant mass or nodularity is noted in either breast in 2 positions examined. Incision is well-healed. No axillary or supraclavicular adenopathy is appreciated. Cosmetic result is excellent.  Well-developed well-nourished patient in NAD. HEENT reveals PERLA, EOMI, discs not visualized.  Oral cavity is clear. No oral mucosal lesions are identified. Neck is clear without evidence of cervical or supraclavicular adenopathy. Lungs are clear to A&P. Cardiac examination is essentially unremarkable with regular rate and rhythm without murmur rub or thrill. Abdomen is benign with no organomegaly or masses noted. Motor sensory and DTR levels are equal and symmetric in the upper and lower extremities. Cranial nerves II through XII are grossly intact. Proprioception is intact. No peripheral adenopathy or  edema is identified. No motor or sensory levels are noted. Crude visual fields are within normal range.  RADIOLOGY RESULTS: Mammograms reviewed compatible with above-stated findings  PLAN: Present time patient is now 63 months out showing no evidence of disease.  She is doing well.  And pleased with her overall progress.  Of asked to see her back in 6 months and then will start once year follow-up visits.  She continues on Femara without side effect.  Patient knows to call with any concerns.  I would like to take this opportunity to thank you for allowing me to participate in the care of your patient..Noreene Filbert MD

## 2022-04-11 ENCOUNTER — Ambulatory Visit
Admission: RE | Admit: 2022-04-11 | Discharge: 2022-04-11 | Disposition: A | Discharge status: Home / Self Care | Payer: BC Managed Care – PPO | Source: Ambulatory Visit | Attending: Surgery | Admitting: Surgery

## 2022-04-11 DIAGNOSIS — Z853 Personal history of malignant neoplasm of breast: Secondary | ICD-10-CM | POA: Insufficient documentation

## 2022-04-19 ENCOUNTER — Ambulatory Visit: Payer: BC Managed Care – PPO | Admitting: Surgery

## 2022-04-19 ENCOUNTER — Encounter: Payer: Self-pay | Admitting: Surgery

## 2022-04-19 VITALS — BP 126/77 | HR 84 | Temp 98.1°F | Ht 68.0 in | Wt 262.0 lb

## 2022-04-19 DIAGNOSIS — M6208 Separation of muscle (nontraumatic), other site: Secondary | ICD-10-CM | POA: Diagnosis not present

## 2022-04-19 DIAGNOSIS — K429 Umbilical hernia without obstruction or gangrene: Secondary | ICD-10-CM | POA: Diagnosis not present

## 2022-04-19 DIAGNOSIS — Z08 Encounter for follow-up examination after completed treatment for malignant neoplasm: Secondary | ICD-10-CM

## 2022-04-19 DIAGNOSIS — Z170 Estrogen receptor positive status [ER+]: Secondary | ICD-10-CM | POA: Diagnosis not present

## 2022-04-19 DIAGNOSIS — C50212 Malignant neoplasm of upper-inner quadrant of left female breast: Secondary | ICD-10-CM

## 2022-04-19 NOTE — Patient Instructions (Addendum)
The patient has been asked to return to the office in one year with a bilateral diagnostic mammogram.  We will send you a letter about these appointments.  Continue self breast exams. Call office for any new breast issues or concerns.    Breast Self-Awareness Breast self-awareness means being familiar with how your breasts look and feel. It involves checking your breasts regularly and telling your health care provider about any changes. Practicing breast self-awareness helps to maintain breast health. Sometimes, changes are not harmful (are benign). Other times, a change in your breasts can be a sign of a serious medical problem. Being familiar with the look and feel of your breasts can help you catch a breast problem while it is still small and can be treated. You should do breast self-exams even if you have breast implants. What you need: A mirror. A well-lit room. A pillow or other soft object. How to do a breast self-exam A breast self-exam is one way to learn what is normal for your breasts and whether your breasts are changing. To do a breast self-exam: Look for changes  Remove all the clothing above your waist. Stand in front of a mirror in a room with good lighting. Put your hands down at your sides. Compare your breasts in the mirror. Look for differences between them (asymmetry), such as: Differences in shape. Differences in size. Puckers, dips, and bumps in one breast and not the other. Look at each breast for changes in the skin, such as: Redness. Scaly areas. Skin thickening. Dimpling. Open sores (ulcers). Look for changes in your nipples, such as: Discharge. Bleeding. Dimpling. Redness. A nipple that looks pushed in (retracted), or that has changed position. Feel for changes Carefully feel your breasts for lumps and changes. It is best to do this self-exam while lying down. Follow these steps to feel each breast: Place a pillow under the shoulder of one side of  your body. Place the arm of that side of your body behind your head. Feel the breast of that side of your body using the hand of the opposite arm. To do this: Start in the nipple area and use the pads of your three middle fingers to make -inch (2 cm) overlapping circles. Use light, medium, and then firm pressure as you feel your breast, gently covering the entire breast area and armpit. Continue the overlapping circles, moving downward over the breast until you feel your ribs below your breast. Then, make circles with your fingers going upward until you reach your collarbone. Next, make circles by moving outward across your breast and into your armpit area. Squeeze the nipple. Check for discharge and lumps. Repeat steps 1-7 to check your other breast. Sit or stand in the tub or shower. With soapy water on your skin, feel each breast the same way you did when you were lying down. Write down what you find Writing down what you find can help you remember what to discuss with your health care provider. Write down: What is normal for each breast. Any changes that you find in each breast. These include: The kind of changes you find. Any pain or tenderness. Size and location of any lumps. Where you are in your menstrual cycle, if you are still getting your menstrual period (menstruating). General tips If you are breastfeeding, the best time to examine your breasts is after a feeding or after using a breast pump. If you menstruate, the best time to examine your breasts is 5-7 days  after your menstrual period. Breasts are generally lumpier during menstrual periods, and it may be more difficult to notice changes. With time and practice, you will become more familiar with the differences in your breasts and more comfortable with the exam. Contact a health care provider if: You see a change in the shape or size of your breasts or nipples. You see a change in the skin of your breast or nipples, such as  a reddened or scaly area. You have unusual discharge from your nipples. You find a new lump or thick area. You have breast pain. You have any concerns about your breast health. Summary Breast self-awareness includes looking for physical changes in your breasts and feeling for any changes within your breasts. Breast self-awareness should be done in front of a mirror in a well-lit room. If you menstruate, the best time to examine your breasts is 5-7 days after your menstrual period. Tell your health care provider about any changes you notice in your breasts. Changes include changes in size, changes on the skin, pain or tenderness, or unusual fluid from your nipples. This information is not intended to replace advice given to you by your health care provider. Make sure you discuss any questions you have with your health care provider. Document Revised: 07/04/2021 Document Reviewed: 06/15/2021 Elsevier Patient Education  [place].

## 2022-04-19 NOTE — Progress Notes (Signed)
Surgical Clinic Progress/Follow-up Note   HPI:  61 y.o. Female presents to clinic for left breast cancer follow-up.   She had completed her radiation with tan- like postradiation changes of the left breast.  She currently is taking her letrozole and tolerating it well, with exception of some knee pain.  Patient reports  improvement/resolution of prior issues and has been tolerating regular diet, denies N/V, fever/chills, CP, or SOB.  Her blood pressure appears to be in good control.  Review of Systems:  Constitutional: denies fever/chills  ENT: denies sore throat, hearing problems  Respiratory: denies shortness of breath, wheezing  Cardiovascular: denies chest pain, palpitations  Gastrointestinal: denies abdominal pain, N/V, or diarrhea/and bowel function as per interval history Skin: Denies any other rashes or skin discolorations except post-surgical wounds as per interval history  Vital Signs:  BP 126/77   Pulse 84   Temp 98.1 F (36.7 C)   Ht '5\' 8"'$  (1.727 m)   Wt 262 lb (118.8 kg)   SpO2 96%   BMI 39.84 kg/m    Physical Exam:  Constitutional:  -- Obese body habitus  -- Awake, alert, and oriented x3  Pulmonary:  -- No crackles -- Equal breath sounds bilaterally -- Breathing non-labored at rest Cardiovascular:  -- S1, S2 present  -- No pericardial rubs  Gastrointestinal:  -- Soft and well rounded with remarkable diastases, and small umbilical fascial defect, non-tender.   GU  --Caryl Lyn present as chaperone: Left breast exam with upper inner quadrant postoperative lumpectomy site changes, postradiation changes as noted.  Still quite soft and supple without dominant masses nor suspicious nodularity.  Left breast more prominent inframammary ridge compared to right.  Right breast soft and supple without suspicious or dominant nodularity or masses present. Musculoskeletal / Integumentary:  -- Wounds or skin discoloration: None appreciated except post-surgical/postirradiation  changes as described above left breast. -- Extremities: B/L UE and LE FROM, hands and feet warm, no edema    Imaging:  CLINICAL DATA:  History of LEFT lumpectomy in October 2022.   EXAM: DIGITAL DIAGNOSTIC BILATERAL MAMMOGRAM WITH TOMOSYNTHESIS   TECHNIQUE: Bilateral digital diagnostic mammography and breast tomosynthesis was performed.   COMPARISON:  Previous exam(s).   ACR Breast Density Category b: There are scattered areas of fibroglandular density.   FINDINGS: Post operative changes are seen in the LEFTbreast. No suspicious mass, distortion, or microcalcifications are identified to suggest presence of malignancy.   IMPRESSION: No mammographic evidence for malignancy.   RECOMMENDATION: Diagnostic mammogram is suggested in 1 year. (Code:DM-B-01Y)   I have discussed the findings and recommendations with the patient. If applicable, a reminder letter will be sent to the patient regarding the next appointment.   BI-RADS CATEGORY  2: Benign.     Electronically Signed   By: Nolon Nations M.D.   On: 04/11/2022 10:08  Assessment:  61 y.o. yo Female with a problem list including...  Patient Active Problem List   Diagnosis Date Noted   Genetic testing 06/15/2021   Malignant neoplasm of upper-inner quadrant of left breast in female, estrogen receptor positive ([facility]) 06/15/2021   Postprocedural hypothyroidism 02/24/2019   Allergic rhinitis 03/21/2013   Obesity 03/21/2013    presents to clinic for follow-up evaluation of left breast cancer, progressing well.  Plan:              - return to clinic following next imaging, or as needed, instructed to call office if any questions or concerns  All of the above recommendations were  discussed with the patient and patient's family, and all of patient's and family's questions were answered to her expressed satisfaction.  These notes generated with voice recognition software. I apologize for typographical errors.  Ronny Bacon, MD, FACS [place]: [facility]. Office: [phone]

## 2022-05-17 ENCOUNTER — Inpatient Hospital Stay: Payer: BC Managed Care – PPO

## 2022-05-17 ENCOUNTER — Inpatient Hospital Stay: Payer: BC Managed Care – PPO | Admitting: Oncology

## 2022-05-29 ENCOUNTER — Ambulatory Visit (INDEPENDENT_AMBULATORY_CARE_PROVIDER_SITE_OTHER): Payer: BC Managed Care – PPO | Admitting: Pulmonary Disease

## 2022-05-29 ENCOUNTER — Encounter: Payer: Self-pay | Admitting: Pulmonary Disease

## 2022-05-29 ENCOUNTER — Other Ambulatory Visit
Admission: RE | Admit: 2022-05-29 | Discharge: 2022-05-29 | Disposition: A | Discharge status: Home / Self Care | Payer: BC Managed Care – PPO | Attending: Pulmonary Disease | Admitting: Pulmonary Disease

## 2022-05-29 VITALS — BP 130/83 | HR 82 | Temp 97.9°F | Ht 68.0 in | Wt 268.4 lb

## 2022-05-29 DIAGNOSIS — J4540 Moderate persistent asthma, uncomplicated: Secondary | ICD-10-CM

## 2022-05-29 DIAGNOSIS — J3089 Other allergic rhinitis: Secondary | ICD-10-CM

## 2022-05-29 LAB — CBC WITH DIFFERENTIAL/PLATELET
Abs Immature Granulocytes: 0.03 10*3/uL (ref 0.00–0.07)
Basophils Absolute: 0.1 10*3/uL (ref 0.0–0.1)
Basophils Relative: 1 %
Eosinophils Absolute: 0.8 10*3/uL — ABNORMAL HIGH (ref 0.0–0.5)
Eosinophils Relative: 9 %
HCT: 43.8 % (ref 36.0–46.0)
Hemoglobin: 14 g/dL (ref 12.0–15.0)
Immature Granulocytes: 0 %
Lymphocytes Relative: 14 %
Lymphs Abs: 1.4 10*3/uL (ref 0.7–4.0)
MCH: 29.5 pg (ref 26.0–34.0)
MCHC: 32 g/dL (ref 30.0–36.0)
MCV: 92.4 fL (ref 80.0–100.0)
Monocytes Absolute: 0.5 10*3/uL (ref 0.1–1.0)
Monocytes Relative: 5 %
Neutro Abs: 6.7 10*3/uL (ref 1.7–7.7)
Neutrophils Relative %: 71 %
Platelets: 319 10*3/uL (ref 150–400)
RBC: 4.74 MIL/uL (ref 3.87–5.11)
RDW: 14 % (ref 11.5–15.5)
WBC: 9.4 10*3/uL (ref 4.0–10.5)
nRBC: 0 % (ref 0.0–0.2)

## 2022-05-29 LAB — NITRIC OXIDE: Nitric Oxide: 66

## 2022-05-29 MED ORDER — TRELEGY ELLIPTA 200-62.5-25 MCG/ACT IN AEPB: 1.0000 | INHALATION_SPRAY | Freq: Every day | RESPIRATORY_TRACT | 0 refills | Status: DC

## 2022-05-29 NOTE — Progress Notes (Signed)
Subjective:    Patient ID: Breanna [name], female    DOB: 1960-12-22, 61 y.o.   MRN: [id number] Patient Care Team: Oletha Blend, MD as PCP - General (Family Medicine) Carmina Miller, MD as Consulting Physician (Radiation Oncology) Rickard Patience, MD as Consulting Physician (Oncology) Campbell Lerner, MD as Consulting Physician (General Surgery)  Chief Complaint  Patient presents with   Follow-up    Asthma. SOB when walking. Tired easy.    HPI The patient is a 61 year old lifelong never smoker who has carried a diagnosis of asthma for 10 + years.  She was initially referred for preoperative evaluation prior to breast lumpectomy with sentinel node biopsy due to  invasive mammary carcinoma (ER/PR positive HER2 negative).  The patient underwent lumpectomy and sentinel node biopsy on 07 June 2021.  She finished XRT on 31 August 2021.  She has been on letrozole since then.  The patient has been maintained on Symbicort 160/4.52 puffs twice a day and Singulair 10 mg p.o. daily for management of her asthma.  She also uses Ventolin rescue inhaler.  She notes that ends her surgery and radiation she has had more fatigue and tiredness.  She has not had any fevers, chills or sweats.  Has had occasional wheezing.  Has noted increased use of albuterol as rescue approximately 2-3 times per week.  No cough or sputum production.  No hemoptysis.  Overall she feels that she is not well controlled with her asthma.  She does not endorse any orthopnea, paroxysmal nocturnal dyspnea or lower extremity edema.  She does have issues with gastroesophageal reflux but has not had any symptoms of late.  She has noted dyspnea on exertion which is new for her as well.  Review of Systems A 10 point review of systems was performed and it is as noted above otherwise negative.  Patient Active Problem List   Diagnosis Date Noted   Umbilical hernia without obstruction and without gangrene 04/19/2022   Diastasis recti 04/19/2022    Genetic testing 06/15/2021   Malignant neoplasm of upper-inner quadrant of left breast in female, estrogen receptor positive ([facility]) 06/15/2021   Postprocedural hypothyroidism 02/24/2019   Allergic rhinitis 03/21/2013   Obesity 03/21/2013   Social History   Tobacco Use   Smoking status: Never    Passive exposure: Never   Smokeless tobacco: Never  Substance Use Topics   Alcohol use: Never   Allergies  Allergen Reactions   Latex Itching   Other Anaphylaxis and Other (See Comments)    Nut/effect allergies   Sudafed [Pseudoephedrine Hcl] Shortness Of Breath   Silvadene [Silver Sulfadiazine]    Current Meds  Medication Sig   acetaminophen (TYLENOL) 500 MG tablet Take 500 mg by mouth every 6 (six) hours as needed.   albuterol (VENTOLIN HFA) 108 (90 Base) MCG/ACT inhaler Inhale 2 puffs into the lungs every 6 (six) hours as needed for wheezing or shortness of breath.   budesonide-formoterol (SYMBICORT) 160-4.5 MCG/ACT inhaler Inhale 2 puffs into the lungs 2 (two) times daily.   Calcium Carbonate-Vitamin D 600-200 MG-UNIT TABS Take 1 tablet by mouth daily.   Cetirizine HCl 10 MG CAPS Take 10 mg by mouth at bedtime.   Cholecalciferol 50 MCG (2000 UT) CAPS Take 2,000 Units by mouth daily.   esomeprazole (NEXIUM) 40 MG capsule TAKE 1 CAPSULE (40 MG TOTAL) BY MOUTH DAILY BEFORE SUPPER.   fluticasone (FLONASE) 50 MCG/ACT nasal spray Place 1 spray into both nostrils as needed for allergies or rhinitis.  letrozole (FEMARA) 2.5 MG tablet Take 1 tablet (2.5 mg total) by mouth daily.   levothyroxine (SYNTHROID) 75 MCG tablet Take 75 mcg by mouth daily before breakfast.   lidocaine-prilocaine (EMLA) cream Apply to the areola of the surgical breast  and cover with plastic wrap one hour prior to leaving for surgery.   losartan (COZAAR) 50 MG tablet Take 1 tablet by mouth daily.   montelukast (SINGULAIR) 10 MG tablet Take 10 mg by mouth at bedtime.   sodium chloride (OCEAN) 0.65 % SOLN nasal spray  Place 1 spray into both nostrils daily.   Spacer/Aero-Holding Chambers (AEROCHAMBER MV) inhaler 1 each by Miscellaneous route two (2) times a day as needed.   Immunization History  Administered Date(s) Administered   Influenza,inj,Quad PF,6+ Mos 06/25/2017, 09/04/2018, 05/28/2019   Influenza-Unspecified 06/25/2017, 07/19/2020   Moderna Sars-Covid-2 Vaccination 10/23/2019, 11/20/2019, 07/07/2020   Pneumococcal Polysaccharide-23 04/10/2013   Td 08/27/2009   Tdap 12/19/2009, 12/10/2019       Objective:   Physical Exam BP 130/83 (BP Location: Right Arm, Cuff Size: Large)   Pulse 82   Temp 97.9 F (36.6 C)   Ht 5\' 8"  (1.727 m)   Wt 268 lb 6.4 oz (121.7 kg)   SpO2 95%   BMI 40.81 kg/m  GENERAL: This is an obese woman, no acute distress, fully ambulatory.  No conversational dyspnea.  Significant throat clearing during the visit. HEAD: Normocephalic, atraumatic.  EYES: Pupils equal, round, reactive to light.  No scleral icterus.  MOUTH: Oral mucosa moist.  No thrush. NECK: Supple. No thyromegaly. Trachea midline. No JVD.  No adenopathy. PULMONARY: Good air entry bilaterally.  She has faint end expiratory wheezes.Marland Kitchen CARDIOVASCULAR: S1 and S2. Regular rate and rhythm.  No rubs, murmurs or gallops heard. ABDOMEN: Obese otherwise benign. MUSCULOSKELETAL: No joint deformity, no clubbing, no edema.  NEUROLOGIC: No focal deficit, no gait disturbance, speech is fluent. SKIN: Intact,warm,dry. PSYCH: Mood and behavior normal    Lab Results  Component Value Date   NITRICOXIDE 66 05/29/2022        Assessment & Plan:     Orders Placed This Encounter  Procedures   CBC with Differential/Platelet    Standing Status:   Future    Number of Occurrences:   1    Standing Expiration Date:   05/30/2023   Allergen Panel (27) + IGE    Standing Status:   Future    Number of Occurrences:   1    Standing Expiration Date:   05/30/2023   Nitric oxide   Pulmonary Function Test [facility] Only     Standing Status:   Future    Standing Expiration Date:   05/30/2023    Order Specific Question:   Full PFT: includes the following: basic spirometry, spirometry pre & post bronchodilator, diffusion capacity (DLCO), lung volumes    Answer:   Full PFT    Order Specific Question:   This test can only be performed at    Answer:   [facility]   Meds ordered this encounter  Medications   Fluticasone-Umeclidin-Vilant (TRELEGY ELLIPTA) 200-62.5-25 MCG/ACT AEPB    Sig: Inhale 1 puff into the lungs daily.    Dispense:  1 each    Refill:  0    Order Specific Question:   Lot Number?    Answer:   [id number]    Order Specific Question:   Expiration Date?    Answer:   10/26/2023    Order Specific Question:   Quantity  Answer:   1   The appropriate testing has been ordered as above.  Medication changes as noted above.  We will see the patient in follow-up in 2 to 3 months time she is to call sooner should any new problems arise.  Gailen Shelter, MD Advanced Bronchoscopy PCC[facility]    *This note was dictated using voice recognition software/Dragon.  Despite best efforts to proofread, errors can occur which can change the meaning. Any transcriptional errors that result from this process are unintentional and may not be fully corrected at the time of dictation.

## 2022-05-29 NOTE — Patient Instructions (Addendum)
We are going to get some breathing tests, this will tell us more about your lung capacity.  The inflammation level in your airway today was high.  We switched your inhaler to Trelegy Ellipta 200, 1 puff daily.  Make sure you rinse your mouth well after you use it.  Provided you with a sample and a coupon for the Trelegy.  Let us know how you do with it, do not hesitate  DO NOT USE THE [place].  Talk to your primary doctor about checking your heart.  Make sure you follow-up with your vaccine schedule.  I do recommend you get the RSV vaccine.  We will see you in follow-up in 2 to 3 months time call sooner should any new problems arise.

## 2022-05-30 ENCOUNTER — Encounter: Payer: Self-pay | Admitting: Pulmonary Disease

## 2022-05-30 NOTE — Telephone Encounter (Signed)
Below I have copied Dr. Domingo Dimes response.  Have a great day.   We discussed the nitric oxide during her visit as this was the breathing test she had.  This is what told me that she had increased inflammation in her airway.  This was explained to her.   The blood count shows that the eosinophils are high this is flood tells me that she has allergic asthma.  However I do not report on the status until I get all of the tests back and were still pending on her allergy test that was done.  This will help put this into context.  Her lymphocytes are normal.

## 2022-05-30 NOTE — Telephone Encounter (Signed)
Dr. Gonzalez, please advise. Thanks 

## 2022-05-30 NOTE — Telephone Encounter (Signed)
We discussed the nitric oxide during her visit as this was the breathing test she had.  This is what told me that she had increased inflammation in her airway.  This was explained to her.  The blood count shows that the eosinophils are high this is flood tells me that she has allergic asthma.  However I do not report on the status until I get all of the tests back and were still pending on her allergy test that was done.  This will help put this into context.  Her lymphocytes are normal.

## 2022-05-31 LAB — ALLERGEN PANEL (27) + IGE
Alternaria Alternata IgE: <0.1 kU/L
Aspergillus Fumigatus IgE: <0.1 kU/L
Bahia Grass IgE: <0.1 kU/L
Bermuda Grass IgE: <0.1 kU/L
Cat Dander IgE: 0.14 kU/L — AB
Cedar, Mountain IgE: 0.12 kU/L — AB
Cladosporium Herbarum IgE: <0.1 kU/L
Cocklebur IgE: <0.1 kU/L
Cockroach, American IgE: <0.1 kU/L
Common Silver Birch IgE: <0.1 kU/L
D Farinae IgE: 0.41 kU/L — AB
D Pteronyssinus IgE: 0.41 kU/L — AB
Dog Dander IgE: 0.53 kU/L — AB
Elm, American IgE: <0.1 kU/L
Hickory, White IgE: <0.1 kU/L
IgE (Immunoglobulin E), Serum: 400 IU/mL (ref 6–495)
Johnson Grass IgE: <0.1 kU/L
Kentucky Bluegrass IgE: <0.1 kU/L
Maple/Box Elder IgE: <0.1 kU/L
Mucor Racemosus IgE: <0.1 kU/L
Oak, White IgE: <0.1 kU/L
Penicillium Chrysogen IgE: <0.1 kU/L
Pigweed, Rough IgE: <0.1 kU/L
Plantain, English IgE: <0.1 kU/L
Ragweed, Short IgE: <0.1 kU/L
Setomelanomma Rostrat: <0.1 kU/L
Timothy Grass IgE: <0.1 kU/L
White Mulberry IgE: <0.1 kU/L

## 2022-06-07 ENCOUNTER — Encounter: Payer: Self-pay | Admitting: Pulmonary Disease

## 2022-06-07 MED ORDER — TRELEGY ELLIPTA 200-62.5-25 MCG/ACT IN AEPB: 1.0000 | INHALATION_SPRAY | Freq: Every day | RESPIRATORY_TRACT | 11 refills | Status: DC

## 2022-06-07 NOTE — Telephone Encounter (Signed)
Ms. Lockyer,   I have sent a Rx of trelegy to [facility].  Have a great day.

## 2022-06-13 IMAGING — MG MM DIGITAL DIAGNOSTIC UNILAT*L* W/ TOMO W/ CAD
5 series · 6 of 13 positions shown · non-contrast
Comparison: Previous exam(s).

CLINICAL DATA: 60-year-old female presenting for short-term
follow-up status post left breast lumpectomy in Thursday May, 2021.

EXAM:
DIGITAL DIAGNOSTIC UNILATERAL LEFT MAMMOGRAM WITH TOMOSYNTHESIS AND
CAD
TECHNIQUE: Left digital diagnostic mammography and breast tomosynthesis was
performed. The images were evaluated with computer-aided detection.

[L MLO]
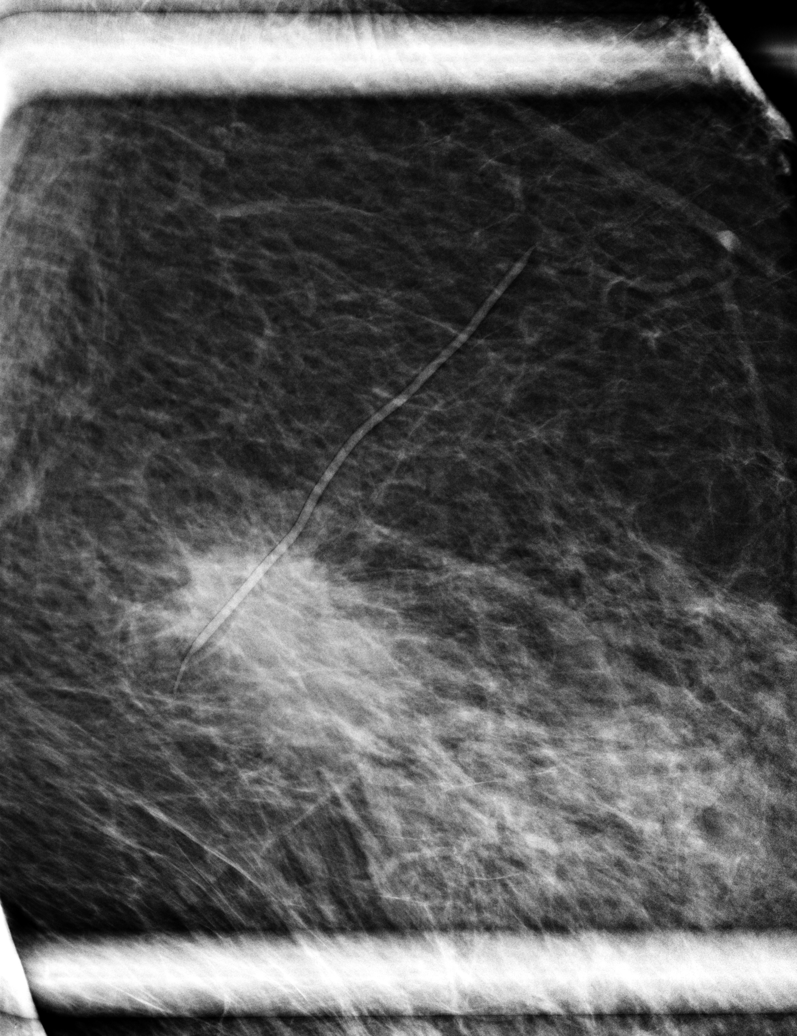

[L MLO synth-2D]
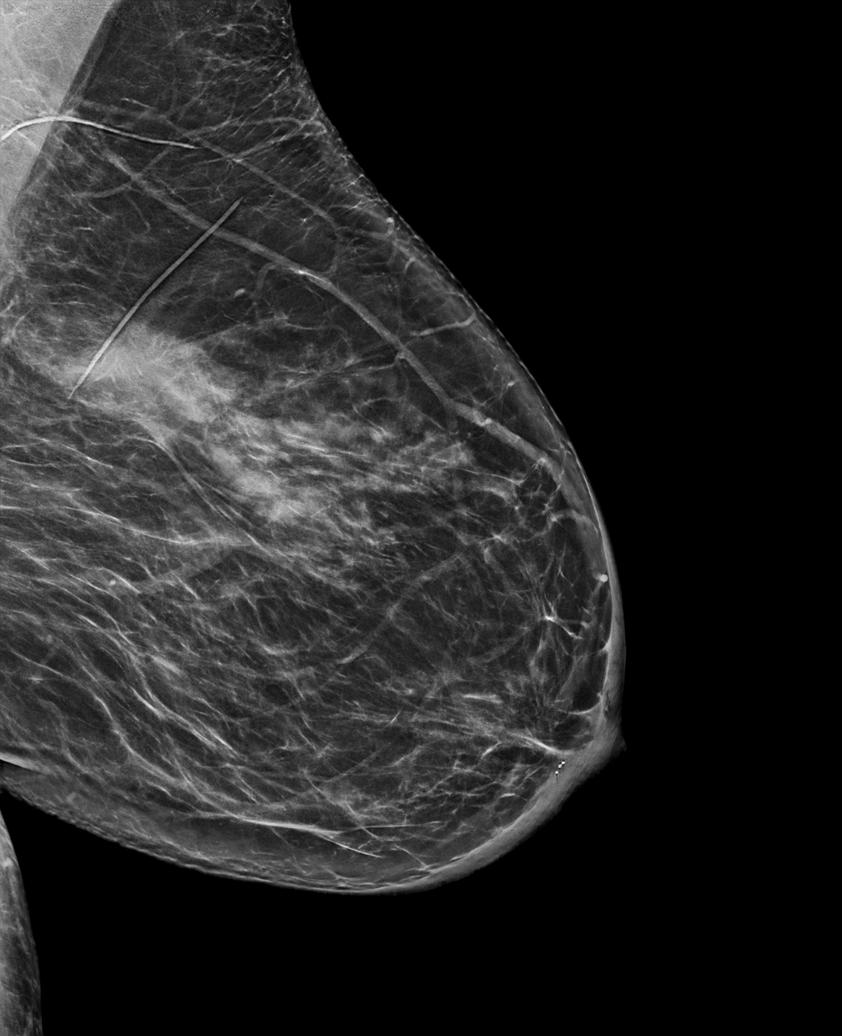

[L CC synth-2D]
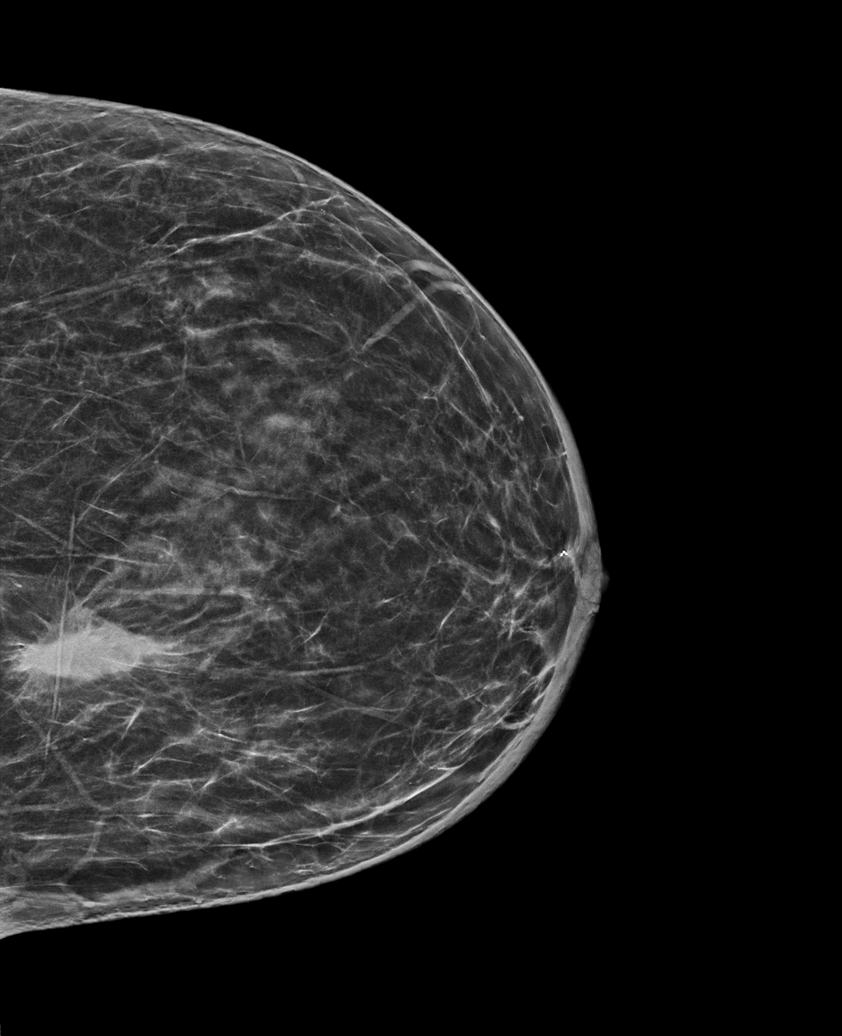

[L CC tomo · 2 of 65 frames shown]
[frame 21/65]
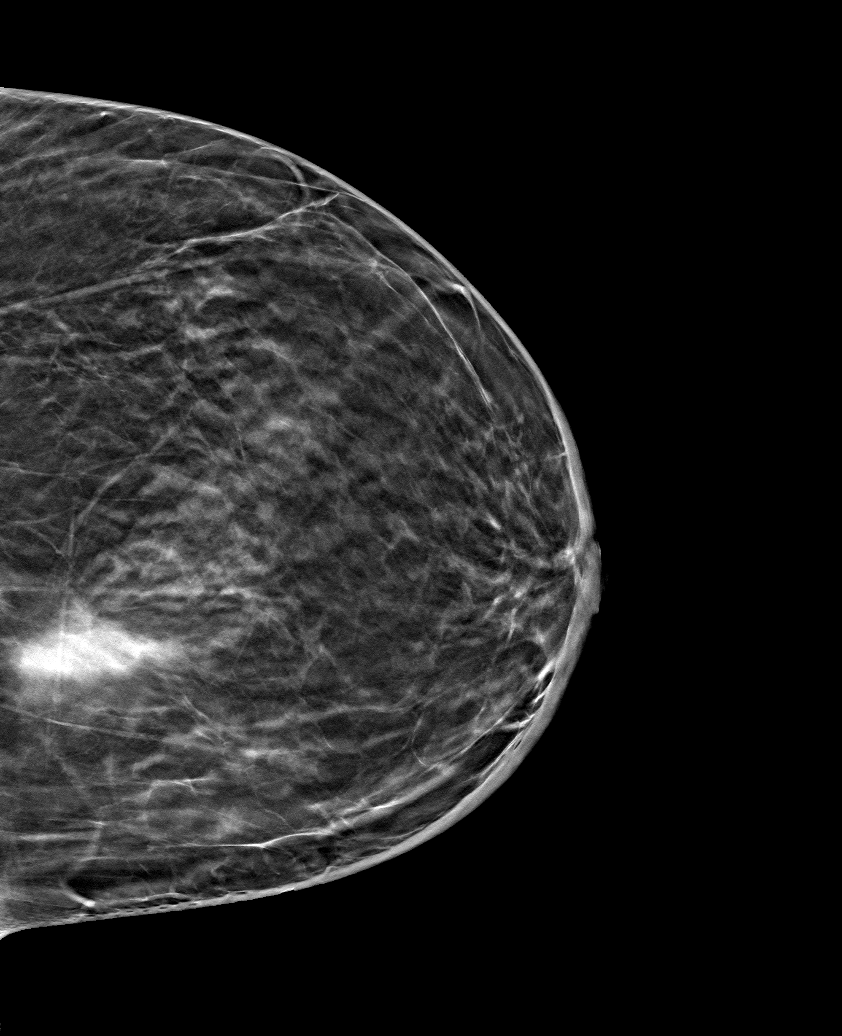
[frame 33/65]
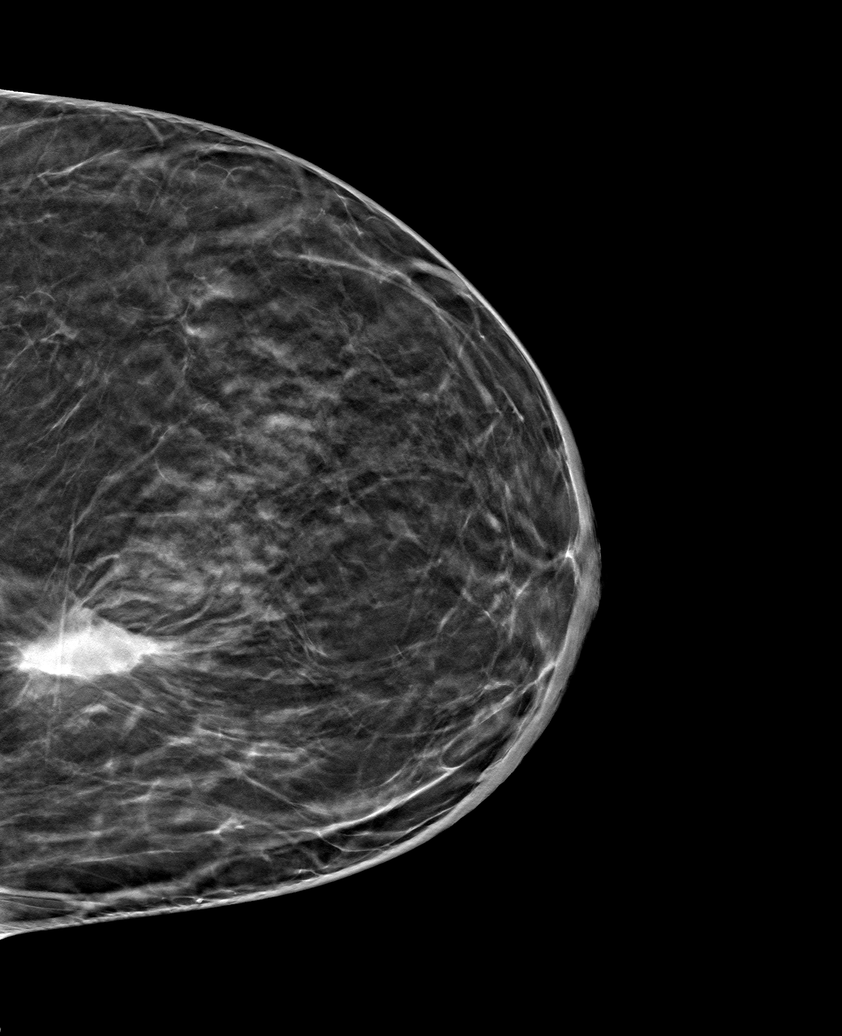

[L MLO tomo · tomo slice 41/80.0]
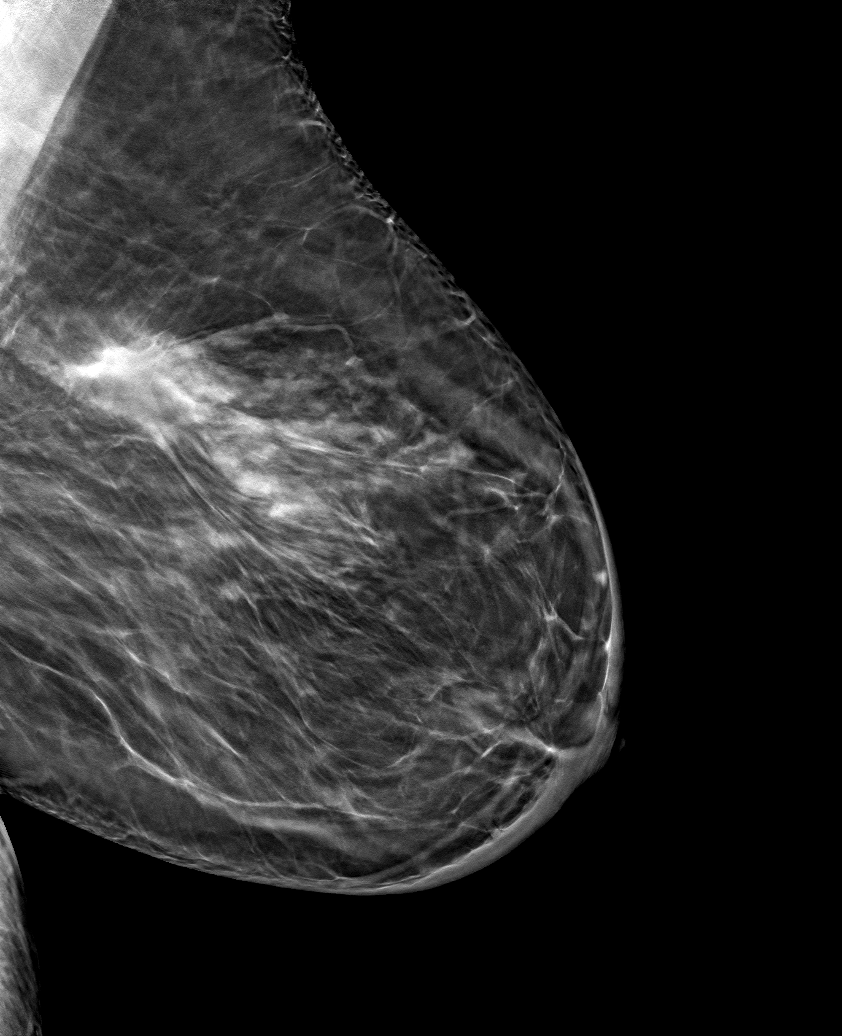

[6 of 13 positions shown; findings below may reference images not displayed]

ACR Breast Density Category b: There are scattered areas of
fibroglandular density.
FINDINGS: Surgical changes noted in the upper inner quadrant of the left
breast consistent with history of lumpectomy. There is mild diffuse
skin thickening consistent with history of radiation therapy. No
suspicious calcifications, masses or areas of distortion are seen in
the left breast.
IMPRESSION: Expected surgical changes in the upper inner posterior left breast.
No evidence of left breast malignancy.

RECOMMENDATION:
Bilateral diagnostic mammogram is recommended in Sunday March, 2022.

I have discussed the findings and recommendations with the patient.
If applicable, a reminder letter will be sent to the patient
regarding the next appointment.

BI-RADS CATEGORY  2: Benign.

## 2022-06-15 ENCOUNTER — Ambulatory Visit: Payer: BC Managed Care – PPO | Admitting: Pulmonary Disease

## 2022-06-16 ENCOUNTER — Other Ambulatory Visit: Payer: Self-pay | Admitting: Oncology

## 2022-06-20 ENCOUNTER — Inpatient Hospital Stay: Payer: BC Managed Care – PPO | Attending: Oncology | Admitting: Oncology

## 2022-06-20 ENCOUNTER — Inpatient Hospital Stay: Payer: BC Managed Care – PPO

## 2022-06-20 ENCOUNTER — Encounter: Payer: Self-pay | Admitting: Oncology

## 2022-06-20 VITALS — BP 137/90 | HR 95 | Temp 96.7°F | Resp 20 | Wt 267.4 lb

## 2022-06-20 DIAGNOSIS — C50212 Malignant neoplasm of upper-inner quadrant of left female breast: Secondary | ICD-10-CM | POA: Diagnosis present

## 2022-06-20 DIAGNOSIS — M8580 Other specified disorders of bone density and structure, unspecified site: Secondary | ICD-10-CM | POA: Insufficient documentation

## 2022-06-20 DIAGNOSIS — Z809 Family history of malignant neoplasm, unspecified: Secondary | ICD-10-CM | POA: Diagnosis not present

## 2022-06-20 DIAGNOSIS — Z79811 Long term (current) use of aromatase inhibitors: Secondary | ICD-10-CM | POA: Diagnosis not present

## 2022-06-20 DIAGNOSIS — R779 Abnormality of plasma protein, unspecified: Secondary | ICD-10-CM | POA: Insufficient documentation

## 2022-06-20 DIAGNOSIS — Z170 Estrogen receptor positive status [ER+]: Secondary | ICD-10-CM | POA: Diagnosis not present

## 2022-06-20 DIAGNOSIS — C50919 Malignant neoplasm of unspecified site of unspecified female breast: Secondary | ICD-10-CM

## 2022-06-20 LAB — CBC WITH DIFFERENTIAL/PLATELET
Abs Immature Granulocytes: 0.02 10*3/uL (ref 0.00–0.07)
Basophils Absolute: 0.1 10*3/uL (ref 0.0–0.1)
Basophils Relative: 1 %
Eosinophils Absolute: 0.9 10*3/uL — ABNORMAL HIGH (ref 0.0–0.5)
Eosinophils Relative: 10 %
HCT: 44.2 % (ref 36.0–46.0)
Hemoglobin: 14.1 g/dL (ref 12.0–15.0)
Immature Granulocytes: 0 %
Lymphocytes Relative: 18 %
Lymphs Abs: 1.6 10*3/uL (ref 0.7–4.0)
MCH: 30 pg (ref 26.0–34.0)
MCHC: 31.9 g/dL (ref 30.0–36.0)
MCV: 94 fL (ref 80.0–100.0)
Monocytes Absolute: 0.5 10*3/uL (ref 0.1–1.0)
Monocytes Relative: 6 %
Neutro Abs: 5.7 10*3/uL (ref 1.7–7.7)
Neutrophils Relative %: 65 %
Platelets: 315 10*3/uL (ref 150–400)
RBC: 4.7 MIL/uL (ref 3.87–5.11)
RDW: 13.8 % (ref 11.5–15.5)
WBC: 8.8 10*3/uL (ref 4.0–10.5)
nRBC: 0 % (ref 0.0–0.2)

## 2022-06-20 LAB — COMPREHENSIVE METABOLIC PANEL
ALT: 10 U/L (ref 0–44)
AST: 14 U/L — ABNORMAL LOW (ref 15–41)
Albumin: 4.1 g/dL (ref 3.5–5.0)
Alkaline Phosphatase: 62 U/L (ref 38–126)
Anion gap: 7 (ref 5–15)
BUN: 15 mg/dL (ref 8–23)
CO2: 27 mmol/L (ref 22–32)
Calcium: 9.2 mg/dL (ref 8.9–10.3)
Chloride: 105 mmol/L (ref 98–111)
Creatinine, Ser: 0.95 mg/dL (ref 0.44–1.00)
GFR, Estimated: >60 mL/min (ref >60)
Glucose, Bld: 113 mg/dL — ABNORMAL HIGH (ref 70–99)
Potassium: 4.1 mmol/L (ref 3.5–5.1)
Sodium: 139 mmol/L (ref 135–145)
Total Bilirubin: 0.6 mg/dL (ref 0.3–1.2)
Total Protein: 8.3 g/dL — ABNORMAL HIGH (ref 6.5–8.1)

## 2022-06-20 NOTE — Assessment & Plan Note (Signed)
Check SPEP, IFE, light chain ratio

## 2022-06-20 NOTE — Progress Notes (Signed)
Pt will like to discuss if she can take metformin '500mg'$  BID with her letrozole. Pt states Dr. Valetta Close advised it is okay but will like to confirm with Dr. Tasia Catchings.

## 2022-06-20 NOTE — Assessment & Plan Note (Signed)
genetic testing was negative.

## 2022-06-20 NOTE — Assessment & Plan Note (Signed)
#  stage IA left invasive breast carcinoma, pT1a pN0 ER+ PR+ HER2- Status post lumpectomy with sentinel lymph node biopsy.-finished adjuvant radiation Currently on adjuvant enocrine therapy with letrozole 2.20m daily.  Tolerates well.  Annual mammogram, by Dr.Rodenberg's office.

## 2022-06-20 NOTE — Assessment & Plan Note (Addendum)
10/30/21 DEXA results were reviewed.FRAX major fracture 3.5% in 10 years.  Repeat study in 2 years.  Recommend calcium and vitamin D supplementation.

## 2022-06-20 NOTE — Progress Notes (Signed)
Hematology/Oncology Progress note [phone] [phone]      Patient Care Team: Verdie Shire, MD as PCP - General (Family Medicine) Noreene Filbert, MD as Consulting Physician (Radiation Oncology) Earlie Server, MD as Consulting Physician (Oncology) Ronny Bacon, MD as Consulting Physician (General Surgery)  ASSESSMENT & PLAN:   Cancer Staging  Malignant neoplasm of upper-inner quadrant of left breast in female, estrogen receptor positive [facility]) Staging form: Breast, AJCC 8th Edition - Pathologic stage from 06/07/2021: Stage IA (pT1a, pN0, cM0, G1, ER+, PR+, HER2-) - Signed by Earlie Server, MD on 06/15/2021   Family history of cancer genetic testing was negative.    Malignant neoplasm of upper-inner quadrant of left breast in female, estrogen receptor positive ([place]) #stage IA left invasive breast carcinoma, pT1a pN0 ER+ PR+ HER2- Status post lumpectomy with sentinel lymph node biopsy.-finished adjuvant radiation Currently on adjuvant enocrine therapy with letrozole 2.81m daily.  Tolerates well.  Annual mammogram, by Dr.Rodenberg's office.   Osteopenia 10/30/21 DEXA results were reviewed.FRAX major fracture 3.5% in 10 years.  Repeat study in 2 years.  Recommend calcium and vitamin D supplementation.   Elevated blood protein Check SPEP, IFE, light chain ratio  Orders Placed This Encounter  Procedures   CBC with Differential/Platelet    Standing Status:   Future    Standing Expiration Date:   06/21/2023   Comprehensive metabolic panel    Standing Status:   Future    Standing Expiration Date:   06/20/2023   Kappa/lambda light chains    Standing Status:   Future    Standing Expiration Date:   06/20/2023   Multiple Myeloma Panel (SPEP&IFE w/QIG)    Standing Status:   Future    Standing Expiration Date:   06/20/2023   Follow up in 6 months. All questions were answered. The patient knows to call the clinic with any problems, questions or  concerns.  ZEarlie Server MD, PhD [facility] 06/20/2022   CHIEF COMPLAINTS/REASON FOR VISIT:  Follow-up for stage I left breast cancer.  HISTORY OF PRESENTING ILLNESS:   Breanna VIOLANTEis a  61y.o.  female with PMH listed below was seen in consultation at the request of  Bowen, Lauren, MD  for evaluation of left breast cancer 04/11/2021, bilateral screening mammogram recommend further evaluation for possible mass in the left breast. 04/21/2021, diagnostic unilateral left mammogram showed a 3 mm suspicious mass in the posterior upper inner left breast without sonographic correlate.  Left axillary shows normal lymph nodes. 9/14/ 2022, patient underwent upper inner left breast stereotactic guided core biopsy.  Invasive mammary carcinoma, no special type.  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 ER 90%, PR 90%, HER2 negative. Patient was referred to establish care with oncology.  She has met surgeon Dr. CCeline Ahr Menarche 11-11yo Patient has 1 child Age at first birth, 89She has remote history of birth control pill use. LMP 48 Denies any previous breast biopsies.  Denies any hormone replacement therapy.  Family history is positive for mother with breast cancer at age of 666  Paternal cousin had breast cancer at age of 374  Paternal uncle and had lung cancer.  06/07/2021, patient underwent lumpectomy and sentinel lymph node biopsy.  No evidence of residual invasive mammary carcinoma.  Lymph nodes were negative.  pT1a pN0 ER+ PR+ HER2 negative  s/p RT finished on 08/31/21  INTERVAL HISTORY Breanna KOLLERis a 61y.o. female who has above history reviewed by me today presents for  follow up visit for management of stage I left breast cancer  Started on letrozole 2.69m daily since end of Jan 2023.  Overall she tolerates well. Manageable hot flush, joint pain.    Review of Systems  Constitutional:  Negative for appetite change, chills, fatigue and fever.   HENT:   Negative for hearing loss and voice change.   Eyes:  Negative for eye problems.  Respiratory:  Negative for chest tightness and cough.   Cardiovascular:  Negative for chest pain.  Gastrointestinal:  Negative for abdominal distention, abdominal pain and blood in stool.  Endocrine: Negative for hot flashes.  Genitourinary:  Negative for difficulty urinating and frequency.   Musculoskeletal:  Negative for arthralgias.  Skin:  Negative for itching and rash.  Neurological:  Negative for extremity weakness.  Hematological:  Negative for adenopathy.  Psychiatric/Behavioral:  Negative for confusion.     MEDICAL HISTORY:  Past Medical History:  Diagnosis Date   Asthma    [place]    [place]   [place]cer [place]    GERD (gastroesophageal reflux disease)    Hypothyroidism    Personal history of radiation therapy    Ventral hernia     SURGICAL HISTORY: Past Surgical History:  Procedure Laterality Date   BREAST BIOPSY Left 05/10/2021   Affirm bx- mass/"coil" invasive CA   BREAST LUMPECTOMY Left 06/07/2021   BREAST LUMPECTOMY,RADIO FREQ LOCALIZER,AXILLARY SENTINEL LYMPH NODE BIOPSY Left 06/07/2021   Procedure: BPanamaLYMPH NODE BIOPSY;  Surgeon: RRonny Bacon MD;  Location: [facility] ORS;  Service: General;  Laterality: Left;   CESAREAN SECTION     COLONOSCOPY     ENDOMETRIAL BIOPSY     HERNIA REPAIR      SOCIAL HISTORY: Social History   Socioeconomic History   Marital status: Married    Spouse name: Not on file   Number of children: Not on file   Years of education: Not on file   Highest education level: Not on file  Occupational History   Not on file  Tobacco Use   Smoking status: Never    Passive exposure: Never   Smokeless tobacco: Never  Vaping Use   Vaping Use: Never used  Substance and Sexual Activity   Alcohol use: Never   Drug use: Never   Sexual activity: Not on file  Other Topics Concern   Not on  file  Social History Narrative   Not on file   Social Determinants of Health   Financial Resource Strain: Not on file  Food Insecurity: Not on file  Transportation Needs: Not on file  Physical Activity: Not on file  Stress: Not on file  Social Connections: Not on file  Intimate Partner Violence: Not on file    FAMILY HISTORY: Family History  Problem Relation Age of Onset   Breast cancer Mother [phone]  Ovarian cancer Mother    Prostate cancer Paternal Grandfather    Breast cancer Cousin [phone]      pat cousin   Lung cancer Paternal Aunt    Lung cancer Paternal Uncle     ALLERGIES:  is allergic to latex, other, sudafed [pseudoephedrine hcl], and silvadene [silver sulfadiazine].  MEDICATIONS:  Current Outpatient Medications  Medication Sig Dispense Refill   acetaminophen (TYLENOL) 500 MG tablet Take 500 mg by mouth every 6 (six) hours as needed.     albuterol (VENTOLIN HFA) 108 (90 Base) MCG/ACT inhaler Inhale 2 puffs into the lungs every 6 (six) hours as needed  for wheezing or shortness of breath. 8 g 3   Calcium Carbonate-Vitamin D 600-200 MG-UNIT TABS Take 1 tablet by mouth daily.     Cetirizine HCl 10 MG CAPS Take 10 mg by mouth at bedtime.     Cholecalciferol 50 MCG (2000 UT) CAPS Take 2,000 Units by mouth daily.     esomeprazole (NEXIUM) 40 MG capsule TAKE 1 CAPSULE (40 MG TOTAL) BY MOUTH DAILY BEFORE SUPPER. 30 capsule 1   fluticasone (FLONASE) 50 MCG/ACT nasal spray Place 1 spray into both nostrils as needed for allergies or rhinitis.     Fluticasone-Umeclidin-Vilant (TRELEGY ELLIPTA) 200-62.5-25 MCG/ACT AEPB Inhale 1 puff into the lungs daily. 1 each 11   letrozole (FEMARA) 2.5 MG tablet TAKE 1 TABLET BY MOUTH EVERY DAY 90 tablet 1   levothyroxine (SYNTHROID) 75 MCG tablet Take 75 mcg by mouth daily before breakfast.     losartan (COZAAR) 50 MG tablet Take 1 tablet by mouth daily.     metFORMIN (GLUCOPHAGE) 500 MG tablet Take by mouth 2 (two) times daily with a meal.      montelukast (SINGULAIR) 10 MG tablet Take 10 mg by mouth at bedtime.     Spacer/Aero-Holding Chambers (AEROCHAMBER MV) inhaler 1 each by Miscellaneous route two (2) times a day as needed. (Patient not taking: Reported on 06/20/2022)     triamcinolone cream (KENALOG) 0.1 % Apply 1 application topically daily as needed (Rash). (Patient not taking: Reported on 05/29/2022)     No current facility-administered medications for this visit.     PHYSICAL EXAMINATION: ECOG PERFORMANCE STATUS: 0 - Asymptomatic Vitals:   06/20/22 1030  BP: (!) 137/90  Pulse: 95  Resp: 20  Temp: (!) 96.7 F (35.9 C)  SpO2: 96%   Filed Weights   06/20/22 1030  Weight: 267 lb 6.4 oz (121.3 kg)    Physical Exam Constitutional:      General: She is not in acute distress. HENT:     Head: Normocephalic and atraumatic.  Eyes:     General: No scleral icterus. Cardiovascular:     Rate and Rhythm: Normal rate and regular rhythm.     Heart sounds: Normal heart sounds.  Pulmonary:     Effort: Pulmonary effort is normal. No respiratory distress.     Breath sounds: No wheezing.  Abdominal:     General: Bowel sounds are normal. There is no distension.     Palpations: Abdomen is soft.  Musculoskeletal:        General: No deformity. Normal range of motion.     Cervical back: Normal range of motion and neck supple.  Skin:    General: Skin is warm and dry.     Findings: No erythema or rash.  Neurological:     Mental Status: She is alert and oriented to person, place, and time. Mental status is at baseline.     Cranial Nerves: No cranial nerve deficit.     Coordination: Coordination normal.  Psychiatric:        Mood and Affect: Mood normal.     LABORATORY DATA:  I have reviewed the data as listed Lab Results  Component Value Date   WBC 8.8 06/20/2022   HGB 14.1 06/20/2022   HCT 44.2 06/20/2022   MCV 94.0 06/20/2022   PLT 315 06/20/2022   Recent Labs    11/15/21 1426 06/20/22 1010  NA 138 139  K  4.2 4.1  CL 104 105  CO2 27 27  GLUCOSE 104* 113*  BUN 18 15  CREATININE 0.89 0.95  CALCIUM 9.4 9.2  GFRNONAA >60 >60  PROT 8.2* 8.3*  ALBUMIN 4.1 4.1  AST 15 14*  ALT 10 10  ALKPHOS 56 62  BILITOT 0.4 0.6    Iron/TIBC/Ferritin/ %Sat No results found for: "IRON", "TIBC", "FERRITIN", "IRONPCTSAT"    RADIOGRAPHIC STUDIES: I have personally reviewed the radiological images as listed and agreed with the findings in the report. MM DIAG BREAST TOMO BILATERAL  Result Date: 04/11/2022 CLINICAL DATA:  History of LEFT lumpectomy in October 2022. EXAM: DIGITAL DIAGNOSTIC BILATERAL MAMMOGRAM WITH TOMOSYNTHESIS TECHNIQUE: Bilateral digital diagnostic mammography and breast tomosynthesis was performed. COMPARISON:  Previous exam(s). ACR Breast Density Category b: There are scattered areas of fibroglandular density. FINDINGS: Post operative changes are seen in the LEFTbreast. No suspicious mass, distortion, or microcalcifications are identified to suggest presence of malignancy. IMPRESSION: No mammographic evidence for malignancy. RECOMMENDATION: Diagnostic mammogram is suggested in 1 year. (Code:[id number]-B-01Y) I have discussed the findings and recommendations with the patient. If applicable, a reminder letter will be sent to the patient regarding the next appointment. BI-RADS CATEGORY  2: Benign. Electronically Signed   By: Nolon Nations M.D.   On: 04/11/2022 10:08

## 2022-06-22 ENCOUNTER — Encounter: Payer: Self-pay | Admitting: Pulmonary Disease

## 2022-06-22 MED ORDER — METHYLPREDNISOLONE 4 MG PO TBPK: ORAL_TABLET | ORAL | 0 refills | Status: DC

## 2022-06-22 NOTE — Telephone Encounter (Signed)
Spoke to patient and verified preferred pharmacy.  Medrol has been sent to preferred pharmacy. Nothing further needed.

## 2022-06-22 NOTE — Telephone Encounter (Signed)
I suspect it is likely allergic.  Recommend a Medrol Dosepak.  She continues to be stuffed up she continues Afrin 12 hours, 1 spray to each nostril, for no more than 3 days only.

## 2022-06-24 ENCOUNTER — Encounter: Payer: Self-pay | Admitting: Oncology

## 2022-06-25 ENCOUNTER — Inpatient Hospital Stay: Payer: BC Managed Care – PPO

## 2022-06-25 NOTE — Telephone Encounter (Signed)
Please contact pt to r/s today's lab appt until after she finishes her steroid.

## 2022-07-09 ENCOUNTER — Inpatient Hospital Stay: Payer: BC Managed Care – PPO | Attending: Oncology

## 2022-07-09 DIAGNOSIS — Z170 Estrogen receptor positive status [ER+]: Secondary | ICD-10-CM | POA: Diagnosis not present

## 2022-07-09 DIAGNOSIS — C50212 Malignant neoplasm of upper-inner quadrant of left female breast: Secondary | ICD-10-CM | POA: Diagnosis present

## 2022-07-09 DIAGNOSIS — M8580 Other specified disorders of bone density and structure, unspecified site: Secondary | ICD-10-CM | POA: Diagnosis not present

## 2022-07-09 DIAGNOSIS — R779 Abnormality of plasma protein, unspecified: Secondary | ICD-10-CM

## 2022-07-10 LAB — KAPPA/LAMBDA LIGHT CHAINS
Kappa free light chain: 34.9 mg/L — ABNORMAL HIGH (ref 3.3–19.4)
Kappa, lambda light chain ratio: 2.12 — ABNORMAL HIGH (ref 0.26–1.65)
Lambda free light chains: 16.5 mg/L (ref 5.7–26.3)

## 2022-07-11 LAB — MULTIPLE MYELOMA PANEL, SERUM
Albumin SerPl Elph-Mcnc: 3.8 g/dL (ref 2.9–4.4)
Albumin/Glob SerPl: 1.2 (ref 0.7–1.7)
Alpha 1: 0.3 g/dL (ref 0.0–0.4)
Alpha2 Glob SerPl Elph-Mcnc: 0.7 g/dL (ref 0.4–1.0)
B-Globulin SerPl Elph-Mcnc: 1.3 g/dL (ref 0.7–1.3)
Gamma Glob SerPl Elph-Mcnc: 1 g/dL (ref 0.4–1.8)
Globulin, Total: 3.3 g/dL (ref 2.2–3.9)
IgA: 460 mg/dL — ABNORMAL HIGH (ref 87–352)
IgG (Immunoglobin G), Serum: 1115 mg/dL (ref 586–1602)
IgM (Immunoglobulin M), Srm: 78 mg/dL (ref 26–217)
Total Protein ELP: 7.1 g/dL (ref 6.0–8.5)

## 2022-07-23 ENCOUNTER — Encounter: Payer: Self-pay | Admitting: Oncology

## 2022-07-23 NOTE — Telephone Encounter (Signed)
Dr. Yu please advise  

## 2022-07-26 ENCOUNTER — Ambulatory Visit: Payer: BC Managed Care – PPO

## 2022-07-30 ENCOUNTER — Other Ambulatory Visit: Payer: Self-pay | Admitting: Pulmonary Disease

## 2022-07-30 ENCOUNTER — Encounter: Payer: Self-pay | Admitting: Pulmonary Disease

## 2022-07-30 ENCOUNTER — Ambulatory Visit (INDEPENDENT_AMBULATORY_CARE_PROVIDER_SITE_OTHER): Payer: BC Managed Care – PPO | Admitting: Pulmonary Disease

## 2022-07-30 VITALS — BP 138/84 | HR 87 | Temp 97.9°F | Ht 68.0 in | Wt 265.2 lb

## 2022-07-30 DIAGNOSIS — Z170 Estrogen receptor positive status [ER+]: Secondary | ICD-10-CM | POA: Diagnosis not present

## 2022-07-30 DIAGNOSIS — J329 Chronic sinusitis, unspecified: Secondary | ICD-10-CM

## 2022-07-30 DIAGNOSIS — C50212 Malignant neoplasm of upper-inner quadrant of left female breast: Secondary | ICD-10-CM | POA: Diagnosis not present

## 2022-07-30 DIAGNOSIS — J4540 Moderate persistent asthma, uncomplicated: Secondary | ICD-10-CM | POA: Diagnosis not present

## 2022-07-30 LAB — NITRIC OXIDE: Nitric Oxide: 140

## 2022-07-30 NOTE — Progress Notes (Signed)
Subjective:    Patient ID: Breanna [name], female    DOB: 07-29-1961, 61 y.o.   MRN: [id number] Patient Care Team: Oletha Blend, MD as PCP - General (Family Medicine) Carmina Miller, MD as Consulting Physician (Radiation Oncology) Rickard Patience, MD as Consulting Physician (Oncology) Campbell Lerner, MD as Consulting Physician (General Surgery)  No chief complaint on file.   HPI The patient is a 61 year old lifelong never smoker who has carried a diagnosis of asthma for 10 + years.  She was initially referred for preoperative evaluation prior to breast lumpectomy with sentinel node biopsy due to  invasive mammary carcinoma (ER/PR positive HER2 negative).  The patient underwent lumpectomy and sentinel node biopsy on 07 June 2021.  She finished XRT on 31 August 2021.  She has been on letrozole since then.  We last saw her on 29 May 2022 and at that time switch her to Trelegy Ellipta 200.  She also uses Ventolin rescue inhaler.  She notes that ends her surgery and radiation she has had more fatigue and tiredness.  She has not had any fevers, chills or sweats.  Has had occasional wheezing.  Has noted continued need to use albuterol as rescue approximately 2-3 times per week.  No cough or sputum production.  No hemoptysis.  Overall she feels that she is not well controlled with her asthma.  She does not endorse any orthopnea, paroxysmal nocturnal dyspnea or lower extremity edema.  She does have issues with gastroesophageal reflux but has not had any symptoms of late.  She has noted dyspnea on exertion which is new for her as well.   Her main driver for symptoms is severe nasal congestion after her last visit she required a Medrol Dosepak due to severe nasal congestion driven by allergies.  This was provided for her on 22 June 2022.  He states that prednisone helps her nasal symptoms significantly.  We discussed referral to to ENT  Review of Systems A 10 point review of systems was performed and  it is as noted above otherwise negative.  Patient Active Problem List   Diagnosis Date Noted   Osteopenia 06/20/2022   Family history of cancer 06/20/2022   Elevated blood protein 06/20/2022   Umbilical hernia without obstruction and without gangrene 04/19/2022   Diastasis recti 04/19/2022   Genetic testing 06/15/2021   Malignant neoplasm of upper-inner quadrant of left breast in female, estrogen receptor positive (HCC) 06/15/2021   Postprocedural hypothyroidism 02/24/2019   Allergic rhinitis 03/21/2013   Obesity 03/21/2013   Social History   Tobacco Use   Smoking status: Never    Passive exposure: Never   Smokeless tobacco: Never  Substance Use Topics   Alcohol use: Never   Allergies  Allergen Reactions   Latex Itching   Other Anaphylaxis and Other (See Comments)    Nut/effect allergies   Sudafed [Pseudoephedrine Hcl] Shortness Of Breath   Silvadene [Silver Sulfadiazine]    Current Meds  Medication Sig   acetaminophen (TYLENOL) 500 MG tablet Take 500 mg by mouth every 6 (six) hours as needed.   albuterol (VENTOLIN HFA) 108 (90 Base) MCG/ACT inhaler Inhale 2 puffs into the lungs every 6 (six) hours as needed for wheezing or shortness of breath.   azelastine (ASTELIN) 0.1 % nasal spray 1 spray into each nostril two (2) times a day. Use in each nostril as directed   Calcium Carbonate-Vitamin D 600-200 MG-UNIT TABS Take 1 tablet by mouth daily.   Cetirizine HCl 10 MG CAPS  Take 10 mg by mouth at bedtime.   Cholecalciferol 50 MCG (2000 UT) CAPS Take 2,000 Units by mouth daily.   esomeprazole (NEXIUM) 40 MG capsule TAKE 1 CAPSULE (40 MG TOTAL) BY MOUTH DAILY BEFORE SUPPER.   fluticasone (FLONASE) 50 MCG/ACT nasal spray Place 1 spray into both nostrils as needed for allergies or rhinitis.   Fluticasone-Umeclidin-Vilant (TRELEGY ELLIPTA) 200-62.5-25 MCG/ACT AEPB Inhale 1 puff into the lungs daily.   letrozole (FEMARA) 2.5 MG tablet TAKE 1 TABLET BY MOUTH EVERY DAY   levothyroxine  (SYNTHROID) 75 MCG tablet Take 75 mcg by mouth daily before breakfast.   losartan (COZAAR) 50 MG tablet Take 1 tablet by mouth daily.   metFORMIN (GLUCOPHAGE) 500 MG tablet Take by mouth 2 (two) times daily with a meal.   montelukast (SINGULAIR) 10 MG tablet Take 10 mg by mouth at bedtime.   Spacer/Aero-Holding Chambers (AEROCHAMBER MV) inhaler    triamcinolone cream (KENALOG) 0.1 % Apply 1 application  topically daily as needed (Rash).   Immunization History  Administered Date(s) Administered   Influenza,inj,Quad PF,6+ Mos 06/25/2017, 09/04/2018, 05/28/2019, 07/27/2022   Influenza-Unspecified 06/25/2017, 07/19/2020   Moderna Sars-Covid-2 Vaccination 10/23/2019, 11/20/2019, 07/07/2020   Pneumococcal Polysaccharide-23 04/10/2013   Td 08/27/2009   Tdap 12/19/2009, 12/10/2019       Objective:   Physical Exam There were no vitals taken for this visit. GENERAL: This is an obese woman, no acute distress, fully ambulatory.  No conversational dyspnea. HEAD: Normocephalic, atraumatic.  EYES: Pupils equal, round, reactive to light.  No scleral icterus.  MOUTH: Significant turbinate edema, query polyps. NECK: Supple. No thyromegaly. Trachea midline. No JVD.  No adenopathy. PULMONARY: Good air entry bilaterally.  No adventitious sounds. CARDIOVASCULAR: S1 and S2. Regular rate and rhythm.  No rubs, murmurs or gallops heard. ABDOMEN: Obese otherwise benign. MUSCULOSKELETAL: No joint deformity, no clubbing, no edema.  NEUROLOGIC: No focal deficit, no gait disturbance, speech is fluent. SKIN: Intact,warm,dry. PSYCH: Mood and behavior normal   Lab Results  Component Value Date   NITRICOXIDE 140 07/30/2022  *Type II inflammation present    Assessment & Plan:     ICD-10-CM   1. Moderate persistent asthma without complication  J45.40 Nitric oxide   Continue Trelegy Ellipta 200 Continue as needed albuterol Continue Singulair Initiate Dupixent eval    2. 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 Malignant neoplasm of upper-inner quadrant of left breast in female, estrogen receptor positive (HCC)  C50.212    Z17.0    This issue adds complexity to her management Follows with oncology     Orders Placed This Encounter  Procedures   CT MAXILLOFACIAL WO CONTRAST    Standing Status:   Future    Number of Occurrences:   1    Standing Expiration Date:   07/31/2023    Order Specific Question:   Preferred imaging location?    Answer:   [facility]   Nitric oxide   A Medrol Dosepak was sent to the patient's pharmacy.  Will obtain CT of the sinuses on a referral to ENT will be made according to those findings.  We discussed Dupixent for management of her asthma.  We will initiate workup for the same.  See the patient in 4 to 6 weeks time she is to call sooner should any new problems arise.  Gailen Shelter, MD Advanced Bronchoscopy PCCM [place] [facility]  [facility]    *This note was dictated using voice recognition software/Dragon.  Despite best efforts to proofread, errors can occur which can change the meaning. Any transcriptional errors that result from this process are unintentional and may not be fully corrected at the time of dictation.

## 2022-07-30 NOTE — Patient Instructions (Addendum)
We are going to send another prednisone pack to treat your severe nasal congestion.  We are getting a CT of the sinuses, this is an x-ray that will tell us about your sinuses and the possibility that they may have polyps.  This may require a visit to the ear nose and throat specialist.  We are evaluating the possibility of a shot to control your asthma or nasal symptoms.  The shot is called Dupixent.  The first dose would have to be given at our office in [place].  They can teach you how to self administer the shot which is very easy.  This shot is required twice a month.  We will see you in follow-up in 4 to 6 weeks time call sooner should any new problems arise.

## 2022-09-06 ENCOUNTER — Ambulatory Visit: Payer: BC Managed Care – PPO | Attending: Pulmonary Disease

## 2022-09-06 DIAGNOSIS — J4540 Moderate persistent asthma, uncomplicated: Secondary | ICD-10-CM | POA: Insufficient documentation

## 2022-09-06 LAB — PULMONARY FUNCTION TEST ARMC ONLY
DL/VA % pred: 113 %
DL/VA: 4.64 ml/min/mmHg/L
DLCO unc % pred: 90 %
DLCO unc: 20.57 ml/min/mmHg
FEF 25-75 Post: 0.99 L/sec
FEF 25-75 Pre: 0.9 L/sec
FEF2575-%Change-Post: 10 %
FEF2575-%Pred-Post: 40 %
FEF2575-%Pred-Pre: 36 %
FEV1-%Change-Post: 3 %
FEV1-%Pred-Post: 60 %
FEV1-%Pred-Pre: 58 %
FEV1-Post: 1.74 L
FEV1-Pre: 1.67 L
FEV1FVC-%Change-Post: 7 %
FEV1FVC-%Pred-Pre: 86 %
FEV6-%Change-Post: -3 %
FEV6-%Pred-Post: 67 %
FEV6-%Pred-Pre: 69 %
FEV6-Post: 2.41 L
FEV6-Pre: 2.48 L
FEV6FVC-%Change-Post: 0 %
FEV6FVC-%Pred-Post: 103 %
FEV6FVC-%Pred-Pre: 103 %
FVC-%Change-Post: -3 %
FVC-%Pred-Post: 64 %
FVC-%Pred-Pre: 66 %
FVC-Post: 2.41 L
FVC-Pre: 2.49 L
Post FEV1/FVC ratio: 72 %
Post FEV6/FVC ratio: 100 %
Pre FEV1/FVC ratio: 67 %
Pre FEV6/FVC Ratio: 100 %
RV % pred: 111 %
RV: 2.51 L
TLC % pred: 92 %
TLC: 5.22 L

## 2022-09-10 ENCOUNTER — Ambulatory Visit: Payer: BC Managed Care – PPO | Admitting: Radiation Oncology

## 2022-09-11 ENCOUNTER — Ambulatory Visit
Admission: RE | Admit: 2022-09-11 | Discharge: 2022-09-11 | Disposition: A | Discharge status: Home / Self Care | Payer: BC Managed Care – PPO | Source: Ambulatory Visit | Attending: Pulmonary Disease | Admitting: Pulmonary Disease

## 2022-09-11 DIAGNOSIS — J329 Chronic sinusitis, unspecified: Secondary | ICD-10-CM

## 2022-09-12 ENCOUNTER — Encounter: Payer: Self-pay | Admitting: Pulmonary Disease

## 2022-09-12 ENCOUNTER — Ambulatory Visit (INDEPENDENT_AMBULATORY_CARE_PROVIDER_SITE_OTHER): Payer: BC Managed Care – PPO | Admitting: Pulmonary Disease

## 2022-09-12 VITALS — BP 130/84 | HR 86 | Temp 97.8°F | Ht 68.0 in | Wt 269.6 lb

## 2022-09-12 DIAGNOSIS — J339 Nasal polyp, unspecified: Secondary | ICD-10-CM | POA: Diagnosis not present

## 2022-09-12 DIAGNOSIS — J3489 Other specified disorders of nose and nasal sinuses: Secondary | ICD-10-CM | POA: Diagnosis not present

## 2022-09-12 DIAGNOSIS — J329 Chronic sinusitis, unspecified: Secondary | ICD-10-CM

## 2022-09-12 DIAGNOSIS — J4550 Severe persistent asthma, uncomplicated: Secondary | ICD-10-CM

## 2022-09-12 LAB — NITRIC OXIDE: Nitric Oxide: 66

## 2022-09-12 MED ORDER — METHYLPREDNISOLONE 4 MG PO TBPK: ORAL_TABLET | ORAL | 0 refills | Status: DC

## 2022-09-12 MED ORDER — MOMETASONE FUROATE 50 MCG/ACT NA SUSP: 2.0000 | Freq: Every day | NASAL | 2 refills | Status: DC

## 2022-09-12 NOTE — Telephone Encounter (Signed)
She may continue azelastine

## 2022-09-12 NOTE — Progress Notes (Signed)
Subjective:    Patient ID: Breanna [name], female    DOB: October 25, 1960, 62 y.o.   MRN: [id number] Patient Care Team: Verdie Shire, MD as PCP - General (Family Medicine) Noreene Filbert, MD as Consulting Physician (Radiation Oncology) Earlie Server, MD as Consulting Physician (Oncology) Ronny Bacon, MD as Consulting Physician (General Surgery)  Chief Complaint  Patient presents with   Follow-up    Still has some congestion. No cough. No Sob or wheezing.     HPI The patient is a 62 year old lifelong never smoker who has carried a diagnosis of asthma for 11 years.  She has been treated for invasive mammary carcinoma the left.  She has severe persistent asthma and after surgery and institution of letrozole, her symptoms have worsened.  We last saw her on 30 July 2022 at that time her main complaint was the fact that she could not breathe through her nose.  She has become pretty much dependent on steroids.  Her nasal congestion response to steroid taper.  At her last visit we also started the conversation that she may very well need biologics for control of her symptoms and the recommend that 1 would be Dupixent.  She has been somewhat reluctant to consider this.  He did start Trelegy Ellipta 200 her visit of the fourth.  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  We discussed that she will need referral to ENT.  Today she presents with the persistent complaint of nasal congestion.  As far as her pulmonary symptoms of cough shortness of breath or wheezing these are well-controlled with the Trelegy.  What is really making her symptomatic is her nasal symptoms.  She has not had any fevers, chills or sweats.  No purulent nasal discharge and no purulent sputum production.  Does not endorse any other symptomatology.  Review of Systems A 10 point review of systems was performed and it is as noted  above otherwise negative.  Patient Active Problem List   Diagnosis Date Noted   Osteopenia 06/20/2022   Family history of cancer 06/20/2022   Elevated blood protein 40/98/1191   Umbilical hernia without obstruction and without gangrene 04/19/2022   Diastasis recti 04/19/2022   Genetic testing 06/15/2021   Malignant neoplasm of upper-inner quadrant of left breast in female, estrogen receptor positive ([place]) 06/15/2021   Postprocedural hypothyroidism 02/24/2019   Allergic rhinitis 03/21/2013   Obesity 03/21/2013   Social History   Tobacco Use   Smoking status: Never    Passive exposure: Never   Smokeless tobacco: Never  Substance Use Topics   Alcohol use: Never   Allergies  Allergen Reactions   Latex Itching   Other Anaphylaxis and Other (See Comments)    Nut/effect allergies   Sudafed [Pseudoephedrine Hcl] Shortness Of Breath   Silvadene [Silver Sulfadiazine]    Current Meds  Medication Sig   acetaminophen (TYLENOL) 500 MG tablet Take 500 mg by mouth every 6 (six) hours as needed.   albuterol (VENTOLIN HFA) 108 (90 Base) MCG/ACT inhaler Inhale 2 puffs into the lungs every 6 (six) hours as needed for wheezing or shortness of breath.   azelastine (ASTELIN) 0.1 % nasal spray 1 spray into each nostril two (2) times a day. Use in each nostril as directed   Calcium Carbonate-Vitamin D 600-200 MG-UNIT TABS Take 1 tablet by mouth daily.   Cetirizine HCl 10 MG CAPS Take 10 mg by mouth at  bedtime.   Cholecalciferol 50 MCG (2000 UT) CAPS Take 2,000 Units by mouth daily.   cyclobenzaprine (FLEXERIL) 5 MG tablet Take 5 mg by mouth 3 (three) times daily.   esomeprazole (NEXIUM) 40 MG capsule TAKE 1 CAPSULE (40 MG TOTAL) BY MOUTH DAILY BEFORE SUPPER.   fluticasone (FLONASE) 50 MCG/ACT nasal spray Place 1 spray into both nostrils as needed for allergies or rhinitis.   Fluticasone-Umeclidin-Vilant (TRELEGY ELLIPTA) 200-62.5-25 MCG/ACT AEPB Inhale 1 puff into the lungs daily.   ibuprofen  (ADVIL) 600 MG tablet Take 600 mg by mouth every 6 (six) hours as needed.   letrozole (FEMARA) 2.5 MG tablet TAKE 1 TABLET BY MOUTH EVERY DAY   levothyroxine (SYNTHROID) 75 MCG tablet Take 75 mcg by mouth daily before breakfast.   losartan (COZAAR) 50 MG tablet Take 1 tablet by mouth daily.   metFORMIN (GLUCOPHAGE) 500 MG tablet Take by mouth 2 (two) times daily with a meal.   montelukast (SINGULAIR) 10 MG tablet Take 10 mg by mouth at bedtime.   Spacer/Aero-Holding Chambers (AEROCHAMBER MV) inhaler    triamcinolone cream (KENALOG) 0.1 % Apply 1 application  topically daily as needed (Rash).   Immunization History  Administered Date(s) Administered   Influenza,inj,Quad PF,6+ Mos 06/25/2017, 09/04/2018, 05/28/2019, 07/27/2022   Influenza-Unspecified 06/25/2017, 07/19/2020   Moderna Sars-Covid-2 Vaccination 10/23/2019, 11/20/2019, 07/07/2020   Pneumococcal Polysaccharide-23 04/10/2013   Td 08/27/2009   Tdap 12/19/2009, 12/10/2019       Objective:   Physical Exam BP 130/84 (BP Location: Left Arm, Cuff Size: Large)   Pulse 86   Temp 97.8 F (36.6 C)   Ht '5\' 8"'$  (1.727 m)   Wt 269 lb 9.6 oz (122.3 kg)   SpO2 96%   BMI 40.99 kg/m   SpO2: 96 % O2 Device: None (Room air)  GENERAL: This is an obese woman, no acute distress, fully ambulatory.  No conversational dyspnea.  Nasal quality to speech. HEAD: Normocephalic, atraumatic.  EYES: Pupils equal, round, reactive to light.  No scleral icterus.  MOUTH: Some chipped teeth, oral mucosa moist, no thrush. NECK: Supple. No thyromegaly. Trachea midline. No JVD.  No adenopathy. PULMONARY: Good air entry bilaterally.  Coarse breath sounds, otherwise no adventitious sounds. CARDIOVASCULAR: S1 and S2. Regular rate and rhythm.  No rubs, murmurs or gallops heard. ABDOMEN: Obese otherwise benign. MUSCULOSKELETAL: No joint deformity, no clubbing, no edema.  NEUROLOGIC: No focal deficit, no gait disturbance, speech is fluent. SKIN:  Intact,warm,dry. PSYCH: Mood and behavior normal     Lab Results  Component Value Date   NITRICOXIDE 66 09/12/2022  *Prior value 12//2023: 140.     Assessment & Plan:     ICD-10-CM   1. Severe persistent asthma without complication  N98.92 Nitric oxide   On Trelegy Ellipta 200 On as needed albuterol On Singulair Considering Dupixent    2. 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 Nasal polyposis  J33.9 Ambulatory referral to ENT   Nasonex 1 spray to each nostril twice daily Referral to ENT    4. 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    Dispense:  1 each    Refill:  2   methylPREDNISolone (MEDROL DOSEPAK) 4 MG TBPK tablet    Sig: Use as directed    Dispense:  21 each    Refill:  0   We have switched the patient's nasal steroid to Nasonex 1 inhalation twice a day.  She has been referred to [place] office per her request.  She is  We are continuing to pursue [place] therapy.  See the patient in follow-up in 6 to 8 weeks time she is to contact us prior to that time should any new difficulties arise.   Renold Don, MD Advanced Bronchoscopy PCCM [facility]    *This note was dictated using voice recognition software/Dragon.  Despite best efforts to proofread, errors can occur which can change the meaning. Any transcriptional errors that result from this process are unintentional and may not be fully  corrected at the time of dictation.

## 2022-09-12 NOTE — Telephone Encounter (Signed)
Dr. Gonzalez, please advise. Thanks 

## 2022-09-12 NOTE — Patient Instructions (Addendum)
Discontinue the Flonase.  We sent a new nasal inhaler called Nasonex, use this instead.  Severe sinus disease and nasal polyps.  We have sent a referral to the ear nose and throat office in [place].  The name of the group is [facility].  Continue your inhalers as you are doing.  The level of inflammation in your airway was improved today.  We will see you in follow-up in 6 to 8 weeks time call sooner should any new problems arise.

## 2022-09-20 ENCOUNTER — Encounter: Payer: Self-pay | Admitting: Radiation Oncology

## 2022-09-20 ENCOUNTER — Ambulatory Visit
Admission: RE | Admit: 2022-09-20 | Discharge: 2022-09-20 | Disposition: A | Discharge status: Home / Self Care | Payer: BC Managed Care – PPO | Source: Ambulatory Visit | Attending: Radiation Oncology | Admitting: Radiation Oncology

## 2022-09-20 VITALS — BP 128/82 | HR 87 | Temp 98.8°F | Resp 18 | Ht 68.0 in | Wt 266.1 lb

## 2022-09-20 DIAGNOSIS — C50212 Malignant neoplasm of upper-inner quadrant of left female breast: Secondary | ICD-10-CM | POA: Diagnosis not present

## 2022-09-20 DIAGNOSIS — Z170 Estrogen receptor positive status [ER+]: Secondary | ICD-10-CM | POA: Insufficient documentation

## 2022-09-20 DIAGNOSIS — Z79811 Long term (current) use of aromatase inhibitors: Secondary | ICD-10-CM | POA: Diagnosis not present

## 2022-09-20 DIAGNOSIS — Z923 Personal history of irradiation: Secondary | ICD-10-CM | POA: Diagnosis not present

## 2022-09-20 NOTE — Progress Notes (Signed)
Radiation Oncology Follow up Note  Name: Breanna [name]   Date:   09/20/2022 MRN:  [id number] DOB: [birth year]    This 62 y.o. 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  REFERRING PROVIDER: Verdie Shire, MD  HPI: Patient is a 62 year old female now out 1 year completed whole breast radiation to the left breast for stage Ia invasive mammary carcinoma ER/PR positive.  Seen today in routine well.  Tenderness cough or bone pain..  She is currently letrozole tolerating it well without side effect.  She had mammograms back in August which I have reviewed were BI-RADS 2 benign.  She also had a CT scan this month generalized rhinosinusitis showing polyposis with diffuse sinus outflow obstruction.  COMPLICATIONS OF TREATMENT: none  FOLLOW UP COMPLIANCE: keeps appointments   PHYSICAL EXAM:  BP 128/82 (BP Location: Right Arm, Cuff Size: Large)   Pulse 87   Temp 98.8 F (37.1 C)   Resp 18   Ht '5\' 8"'$  (1.727 m)   Wt 266 lb 1.6 oz (120.7 kg)   BMI 40.46 kg/m  Lungs are clear to A&P cardiac examination essentially unremarkable with regular rate and rhythm. No dominant mass or nodularity is noted in either breast in 2 positions examined. Incision is well-healed. No axillary or supraclavicular adenopathy is appreciated. Cosmetic result is excellent.  Well-developed well-nourished patient in NAD. HEENT reveals PERLA, EOMI, discs not visualized.  Oral cavity is clear. No oral mucosal lesions are identified. Neck is clear without evidence of cervical or supraclavicular adenopathy. Lungs are clear to A&P. Cardiac examination is essentially unremarkable with regular rate and rhythm without murmur rub or thrill. Abdomen is benign with no organomegaly or masses noted. Motor sensory and DTR levels are equal and symmetric in the upper and lower extremities. Cranial nerves II through  XII are grossly intact. Proprioception is intact. No peripheral adenopathy or edema is identified. No motor or sensory levels are noted. Crude visual fields are within normal range.  RADIOLOGY RESULTS: Mammogram and CT scans of the sinuses reviewed compatible with above-stated findings  PLAN: Present time patient is doing well now out 1 year from whole breast radiation with no evidence of disease.  On pleased with her overall progress.  I have asked to see her back in 1 year for follow-up.  She continues on letrozole without side effect.  Patient is to call with any concerns.  I would like to take this opportunity to thank you for allowing me to participate in the care of your patient.Noreene Filbert, MD

## 2022-10-26 ENCOUNTER — Encounter: Payer: Self-pay | Admitting: Pulmonary Disease

## 2022-10-26 MED ORDER — METHYLPREDNISOLONE 4 MG PO TBPK: ORAL_TABLET | ORAL | 0 refills | Status: DC

## 2022-10-26 MED ORDER — AZITHROMYCIN 250 MG PO TABS: ORAL_TABLET | ORAL | 0 refills | Status: DC

## 2022-10-26 NOTE — Telephone Encounter (Signed)
We can send a Medrol Dosepak but also send an Azithromycin Z-Pak.  She needs to understand that further management of the sinus issue needs to come from ENT.  So needs to be aware that the Medrol can mask the severity of her disease for the ENT to appreciate.

## 2022-10-29 ENCOUNTER — Ambulatory Visit (INDEPENDENT_AMBULATORY_CARE_PROVIDER_SITE_OTHER): Payer: BC Managed Care – PPO | Admitting: Pulmonary Disease

## 2022-10-29 ENCOUNTER — Encounter: Payer: Self-pay | Admitting: Pulmonary Disease

## 2022-10-29 VITALS — BP 148/96 | HR 71 | Temp 97.9°F | Ht 68.0 in | Wt 267.0 lb

## 2022-10-29 DIAGNOSIS — J339 Nasal polyp, unspecified: Secondary | ICD-10-CM

## 2022-10-29 DIAGNOSIS — J3489 Other specified disorders of nose and nasal sinuses: Secondary | ICD-10-CM | POA: Diagnosis not present

## 2022-10-29 DIAGNOSIS — J329 Chronic sinusitis, unspecified: Secondary | ICD-10-CM

## 2022-10-29 DIAGNOSIS — J4550 Severe persistent asthma, uncomplicated: Secondary | ICD-10-CM

## 2022-10-29 NOTE — Progress Notes (Signed)
Subjective:    Patient ID: Breanna [name], female    DOB: 06-01-1961, 62 y.o.   MRN: [id number] Patient Care Team: Verdie Shire, MD as PCP - General (Family Medicine) Noreene Filbert, MD as Consulting Physician (Radiation Oncology) Earlie Server, MD as Consulting Physician (Oncology) Ronny Bacon, MD as Consulting Physician (General Surgery) Tyler Pita, MD as Consulting Physician (Pulmonary Disease)  Chief Complaint  Patient presents with   Follow-up    No SOB , wheezing, or cough.    HPI The patient is a 62 year old lifelong never smoker who has carried a diagnosis of asthma for well over 10 years.  She diagnosed with invasive mammary carcinoma ER/PR positive HER2 negative and underwent left breast lumpectomy and left axillary sentinel node biopsy.  This was followed by patient. She has severe persistent asthma and after surgery and institution of letrozole, her symptoms have worsened.  We last saw her on 12 September 2022 at that time her main complaint was the fact that she could not breathe through her nose.  She has become pretty much dependent on steroids.  Her nasal congestion response to steroid taper.  She had a sinus CT the day prior to her follow-up appointment that showed generalized rhinosinusitis with polyposis and diffuse sinus outflow obstruction.  She is to see ENT tomorrow.  At her last visit we also started the conversation that she may very well need biologics for control of her symptoms and the recommend that one would be Dupixent.  She did call on 1 March stating that she could not breathe through her nose and wanted another Medrol Dosepak.  I have advised her that she really needs to have ENT evaluate her sinuses but she appeared to be really in distress and Medrol was prescribed.  She notes that she has noted already relief from the intervention.  Has the persistent complaint of nasal congestion.  As far as her pulmonary symptoms of cough shortness of breath or  wheezing these are well-controlled with the Trelegy.  What is really making her symptomatic is her nasal symptoms.  She has not had any fevers, chills or sweats.  No purulent nasal discharge and no purulent sputum production.  Does not endorse any other symptomatology.   Review of Systems A 10 point review of systems was performed and it is as noted above otherwise negative.  Patient Active Problem List   Diagnosis Date Noted   Osteopenia 06/20/2022   Family history of cancer 06/20/2022   Elevated blood protein 123456   Umbilical hernia without obstruction and without gangrene 04/19/2022   Diastasis recti 04/19/2022   Genetic testing 06/15/2021   Malignant neoplasm of upper-inner quadrant of left breast in female, estrogen receptor positive ([place]) 06/15/2021   Postprocedural hypothyroidism 02/24/2019   Allergic rhinitis 03/21/2013   Obesity 03/21/2013   Social History   Tobacco Use   Smoking status: Never    Passive exposure: Never   Smokeless tobacco: Never  Substance Use Topics   Alcohol use: Never   Allergies  Allergen Reactions   Latex Itching   Other Anaphylaxis and Other (See Comments)    Nut/effect allergies   Sudafed [Pseudoephedrine Hcl] Shortness Of Breath   Silvadene [Silver Sulfadiazine]    Current Meds  Medication Sig   acetaminophen (TYLENOL) 500 MG tablet Take 500 mg by mouth every 6 (six) hours as needed.   albuterol (VENTOLIN HFA) 108 (90 Base) MCG/ACT inhaler Inhale 2 puffs into the lungs every 6 (six) hours as needed  for wheezing or shortness of breath.   azithromycin (ZITHROMAX) 250 MG tablet Take 2 tablets (500 mg) on  Day 1,  followed by 1 tablet (250 mg) once daily on Days 2 through 5.   Calcium Carbonate-Vitamin D 600-200 MG-UNIT TABS Take 1 tablet by mouth daily.   Cetirizine HCl 10 MG CAPS Take 10 mg by mouth at bedtime.   Cholecalciferol 50 MCG (2000 UT) CAPS Take 2,000 Units by mouth daily.   cyclobenzaprine (FLEXERIL) 5 MG tablet Take 5 mg  by mouth 3 (three) times daily.   esomeprazole (NEXIUM) 40 MG capsule TAKE 1 CAPSULE (40 MG TOTAL) BY MOUTH DAILY BEFORE SUPPER.   Fluticasone-Umeclidin-Vilant (TRELEGY ELLIPTA) 200-62.5-25 MCG/ACT AEPB Inhale 1 puff into the lungs daily.   ibuprofen (ADVIL) 600 MG tablet Take 600 mg by mouth every 6 (six) hours as needed.   letrozole (FEMARA) 2.5 MG tablet TAKE 1 TABLET BY MOUTH EVERY DAY   levothyroxine (SYNTHROID) 75 MCG tablet Take 75 mcg by mouth daily before breakfast.   losartan (COZAAR) 50 MG tablet Take 1 tablet by mouth daily.   metFORMIN (GLUCOPHAGE) 500 MG tablet Take by mouth 2 (two) times daily with a meal.   methylPREDNISolone (MEDROL DOSEPAK) 4 MG TBPK tablet Use as directed   mometasone (NASONEX) 50 MCG/ACT nasal spray Place 2 sprays into the nose daily.   montelukast (SINGULAIR) 10 MG tablet Take 10 mg by mouth at bedtime.   Spacer/Aero-Holding Chambers (AEROCHAMBER MV) inhaler    triamcinolone cream (KENALOG) 0.1 % Apply 1 application  topically daily as needed (Rash).   Immunization History  Administered Date(s) Administered   Influenza,inj,Quad PF,6+ Mos 06/25/2017, 09/04/2018, 05/28/2019, 07/27/2022   Influenza-Unspecified 06/25/2017, 07/19/2020   Moderna Sars-Covid-2 Vaccination 10/23/2019, 11/20/2019, 07/07/2020   Pneumococcal Polysaccharide-23 04/10/2013   Td 08/27/2009   Tdap 12/19/2009, 12/10/2019       Objective:   Physical Exam BP (!) 148/96 (BP Location: Right Arm, Cuff Size: Large)   Pulse 71   Temp 97.9 F (36.6 C)   Ht '5\' 8"'$  (1.727 m)   Wt 267 lb (121.1 kg)   SpO2 99%   BMI 40.60 kg/m   SpO2: 99 % O2 Device: None (Room air)  GENERAL: This is an obese woman, no acute distress, fully ambulatory.  No conversational dyspnea.  Nasal quality to speech. HEAD: Normocephalic, atraumatic.  EYES: Pupils equal, round, reactive to light.  No scleral icterus.  MOUTH: Some chipped teeth, oral mucosa moist, no thrush. NECK: Supple. No thyromegaly. Trachea  midline. No JVD.  No adenopathy. PULMONARY: Good air entry bilaterally.  Coarse breath sounds, otherwise no adventitious sounds. CARDIOVASCULAR: S1 and S2. Regular rate and rhythm.  No rubs, murmurs or gallops heard. ABDOMEN: Obese otherwise benign. MUSCULOSKELETAL: No joint deformity, no clubbing, no edema.  NEUROLOGIC: No focal deficit, no gait disturbance, speech is fluent. SKIN: Intact,warm,dry. PSYCH: Mood and behavior normal.     Assessment & Plan:     ICD-10-CM   1. Severe persistent asthma without complication  [id number]    Continue Trelegy 200 Continue as needed albuterol Will likely need to be started on Dupixent    2. 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 Nasal polyposis  J33.9    Has ENT evaluation 3/5    4. Obstruction of paranasal sinus  J34.89    Has ENT evaluation 3/5     Patient in follow-up in 6 to 8 weeks time she  is to call sooner should any new problems arise.  Renold Don, MD Advanced Bronchoscopy PCCM [facility]    *This note was dictated using voice recognition software/Dragon.  Despite best efforts to proofread, errors can occur which can change the meaning. Any transcriptional errors that result from this process are unintentional and may not be fully corrected at the time of dictation.

## 2022-10-29 NOTE — Patient Instructions (Signed)
Your lungs sounded good today.  Keep your appointment with ENT tomorrow.  We will see on follow-up and 6 to 8 weeks time call sooner should any new problems arise.

## 2022-12-12 ENCOUNTER — Other Ambulatory Visit: Payer: Self-pay | Admitting: Oncology

## 2022-12-20 ENCOUNTER — Inpatient Hospital Stay (HOSPITAL_BASED_OUTPATIENT_CLINIC_OR_DEPARTMENT_OTHER): Payer: BC Managed Care – PPO | Admitting: Oncology

## 2022-12-20 ENCOUNTER — Inpatient Hospital Stay: Payer: BC Managed Care – PPO | Attending: Oncology

## 2022-12-20 ENCOUNTER — Encounter: Payer: Self-pay | Admitting: Oncology

## 2022-12-20 VITALS — BP 133/84 | HR 87 | Temp 97.4°F | Resp 18 | Wt 267.2 lb

## 2022-12-20 DIAGNOSIS — M8580 Other specified disorders of bone density and structure, unspecified site: Secondary | ICD-10-CM

## 2022-12-20 DIAGNOSIS — C50212 Malignant neoplasm of upper-inner quadrant of left female breast: Secondary | ICD-10-CM | POA: Insufficient documentation

## 2022-12-20 DIAGNOSIS — Z170 Estrogen receptor positive status [ER+]: Secondary | ICD-10-CM | POA: Diagnosis not present

## 2022-12-20 DIAGNOSIS — Z79811 Long term (current) use of aromatase inhibitors: Secondary | ICD-10-CM | POA: Insufficient documentation

## 2022-12-20 DIAGNOSIS — R779 Abnormality of plasma protein, unspecified: Secondary | ICD-10-CM

## 2022-12-20 LAB — CBC WITH DIFFERENTIAL/PLATELET
Abs Immature Granulocytes: 0.04 10*3/uL (ref 0.00–0.07)
Basophils Absolute: 0.1 10*3/uL (ref 0.0–0.1)
Basophils Relative: 1 %
Eosinophils Absolute: 1.2 10*3/uL — ABNORMAL HIGH (ref 0.0–0.5)
Eosinophils Relative: 12 %
HCT: 42.3 % (ref 36.0–46.0)
Hemoglobin: 13.6 g/dL (ref 12.0–15.0)
Immature Granulocytes: 0 %
Lymphocytes Relative: 18 %
Lymphs Abs: 1.9 10*3/uL (ref 0.7–4.0)
MCH: 30 pg (ref 26.0–34.0)
MCHC: 32.2 g/dL (ref 30.0–36.0)
MCV: 93.4 fL (ref 80.0–100.0)
Monocytes Absolute: 0.6 10*3/uL (ref 0.1–1.0)
Monocytes Relative: 6 %
Neutro Abs: 6.5 10*3/uL (ref 1.7–7.7)
Neutrophils Relative %: 63 %
Platelets: 303 10*3/uL (ref 150–400)
RBC: 4.53 MIL/uL (ref 3.87–5.11)
RDW: 14.2 % (ref 11.5–15.5)
WBC: 10.3 10*3/uL (ref 4.0–10.5)
nRBC: 0 % (ref 0.0–0.2)

## 2022-12-20 LAB — COMPREHENSIVE METABOLIC PANEL
ALT: 9 U/L (ref 0–44)
AST: 14 U/L — ABNORMAL LOW (ref 15–41)
Albumin: 4 g/dL (ref 3.5–5.0)
Alkaline Phosphatase: 62 U/L (ref 38–126)
Anion gap: 7 (ref 5–15)
BUN: 15 mg/dL (ref 8–23)
CO2: 27 mmol/L (ref 22–32)
Calcium: 9.2 mg/dL (ref 8.9–10.3)
Chloride: 103 mmol/L (ref 98–111)
Creatinine, Ser: 1.03 mg/dL — ABNORMAL HIGH (ref 0.44–1.00)
GFR, Estimated: >60 mL/min (ref >60)
Glucose, Bld: 115 mg/dL — ABNORMAL HIGH (ref 70–99)
Potassium: 3.9 mmol/L (ref 3.5–5.1)
Sodium: 137 mmol/L (ref 135–145)
Total Bilirubin: 0.4 mg/dL (ref 0.3–1.2)
Total Protein: 7.7 g/dL (ref 6.5–8.1)

## 2022-12-20 NOTE — Assessment & Plan Note (Addendum)
10/30/21 DEXA results were reviewed.FRAX major fracture 3.5% in 10 years.  Repeat study in march 2025 Recommend calcium and vitamin D supplementation.

## 2022-12-20 NOTE — Assessment & Plan Note (Addendum)
#  stage IA left invasive breast carcinoma, pT1a pN0 ER+ PR+ HER2- Status post lumpectomy with sentinel lymph node biopsy.-finished adjuvant radiation Currently on adjuvant enocrine therapy with letrozole 2.5mg  daily. Refills were sent.  Annual mammogram, by Dr.Rodenberg's office. -August 2024

## 2022-12-20 NOTE — Progress Notes (Signed)
Hematology/Oncology Progress note [phone] Fax:([phone]     CHIEF COMPLAINTS/REASON FOR VISIT:  Follow-up for stage I left breast cancer.   ASSESSMENT & PLAN:   Cancer Staging  Malignant neoplasm of upper-inner quadrant of left breast in female, estrogen receptor positive Staging form: Breast, AJCC 8th Edition - Pathologic stage from 06/07/2021: Stage IA (pT1a, pN0, cM0, G1, ER+, PR+, HER2-) - Signed by Rickard Patience, MD on 06/15/2021   Malignant neoplasm of upper-inner quadrant of left breast in female, estrogen receptor positive (HCC) #stage IA left invasive breast carcinoma, pT1a pN0 ER+ PR+ HER2- Status post lumpectomy with sentinel lymph node biopsy.-finished adjuvant radiation Currently on adjuvant enocrine therapy with letrozole 2.5mg  daily. Refills were sent.  Annual mammogram, by Dr.Rodenberg's office. -August 2024  Osteopenia 10/30/21 DEXA results were reviewed.FRAX major fracture 3.5% in 10 years.  Repeat study in march 2025 Recommend calcium and vitamin D supplementation.   Orders Placed This Encounter  Procedures   CMP (Cancer Center only)    Standing Status:   Future    Standing Expiration Date:   12/20/2023   CBC with Differential (Cancer Center Only)    Standing Status:   Future    Standing Expiration Date:   12/20/2023   Follow up in 6 months. All questions were answered. The patient knows to call the clinic with any problems, questions or concerns.  Rickard Patience, MD, PhD [facility] Hematology Oncology 12/20/2022     HISTORY OF PRESENTING ILLNESS:   Breanna [name] is a  62 y.o.  female with PMH listed below was seen in consultation at the request of  Bowen, Lauren, MD  for evaluation of left breast cancer 04/11/2021, bilateral screening mammogram recommend further evaluation for possible mass in the left breast. 04/21/2021, diagnostic unilateral left mammogram showed a 3 mm suspicious mass in the posterior upper inner left breast without  sonographic correlate.  Left axillary shows normal lymph nodes. 9/14/ 2022, patient underwent upper inner left breast stereotactic guided core biopsy.  Invasive mammary carcinoma, no special type.  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 ER 90%, PR 90%, HER2 negative. Patient was referred to establish care with oncology.  She has met surgeon Dr. Lady Gary. Menarche 10-11 yo Patient has 1 child Age at first birth, 64 She has remote history of birth control pill use. LMP 48 Denies any previous breast biopsies.  Denies any hormone replacement therapy.  Family history is positive for mother with breast cancer at age of 64.  Paternal cousin had breast cancer at age of 24.  Paternal uncle and had lung cancer.  06/07/2021, patient underwent lumpectomy and sentinel lymph node biopsy.  No evidence of residual invasive mammary carcinoma.  Lymph nodes were negative.  pT1a pN0 ER+ PR+ HER2 negative  s/p RT finished on 08/31/21  INTERVAL HISTORY Breanna [name] is a 62 y.o. female who has above history reviewed by me today presents for follow up visit for management of stage I left breast cancer  Started on letrozole 2.5mg  daily since end of Jan 2023.  Overall she tolerates well. Manageable hot flush, joint pain.  She has no new breast concerns.    Review of Systems  Constitutional:  Negative for appetite change, chills, fatigue and fever.  HENT:   Negative for hearing loss and voice change.   Eyes:  Negative for eye problems.  Respiratory:  Negative for chest tightness and cough.   Cardiovascular:  Negative for chest pain.  Gastrointestinal:  Negative for abdominal  distention, abdominal pain and blood in stool.  Endocrine: Positive for hot flashes.  Genitourinary:  Negative for difficulty urinating and frequency.   Musculoskeletal:  Positive for arthralgias.  Skin:  Negative for itching and rash.  Neurological:  Negative for extremity weakness.  Hematological:  Negative for  adenopathy.  Psychiatric/Behavioral:  Negative for confusion.     MEDICAL HISTORY:  Past Medical History:  Diagnosis Date   Asthma    Breast cancer    left   Cancer    GERD (gastroesophageal reflux disease)    Hypothyroidism    Personal history of radiation therapy    Ventral hernia     SURGICAL HISTORY: Past Surgical History:  Procedure Laterality Date   BREAST BIOPSY Left 05/10/2021   Affirm bx- mass/"coil" invasive CA   BREAST LUMPECTOMY Left 06/07/2021   BREAST LUMPECTOMY,RADIO FREQ LOCALIZER,AXILLARY SENTINEL LYMPH NODE BIOPSY Left 06/07/2021   Procedure: BREAST LUMPECTOMY,RADIO FREQ LOCALIZER,AXILLARY SENTINEL LYMPH NODE BIOPSY;  Surgeon: Campbell Lerner, MD;  Location: [facility] ORS;  Service: General;  Laterality: Left;   CESAREAN SECTION     COLONOSCOPY     ENDOMETRIAL BIOPSY     HERNIA REPAIR      SOCIAL HISTORY: Social History   Socioeconomic History   Marital status: Married    Spouse name: Not on file   Number of children: Not on file   Years of education: Not on file   Highest education level: Not on file  Occupational History   Not on file  Tobacco Use   Smoking status: Never    Passive exposure: Never   Smokeless tobacco: Never  Vaping Use   Vaping Use: Never used  Substance and Sexual Activity   Alcohol use: Never   Drug use: Never   Sexual activity: Not on file  Other Topics Concern   Not on file  Social History Narrative   Not on file   Social Determinants of Health   Financial Resource Strain: Not on file  Food Insecurity: Not on file  Transportation Needs: Not on file  Physical Activity: Not on file  Stress: Not on file  Social Connections: Not on file  Intimate Partner Violence: Not on file    FAMILY HISTORY: Family History  Problem Relation Age of Onset   Breast cancer Mother 10   Ovarian cancer Mother    Prostate cancer Paternal Grandfather    Breast cancer Cousin 40       pat cousin   Lung cancer Paternal Aunt    Lung  cancer Paternal Uncle     ALLERGIES:  is allergic to latex, other, sudafed [pseudoephedrine hcl], and silvadene [silver sulfadiazine].  MEDICATIONS:  Current Outpatient Medications  Medication Sig Dispense Refill   acetaminophen (TYLENOL) 500 MG tablet Take 500 mg by mouth every 6 (six) hours as needed.     albuterol (VENTOLIN HFA) 108 (90 Base) MCG/ACT inhaler Inhale 2 puffs into the lungs every 6 (six) hours as needed for wheezing or shortness of breath. 8 g 3   Calcium Carbonate-Vitamin D 600-200 MG-UNIT TABS Take 1 tablet by mouth daily.     Cetirizine HCl 10 MG CAPS Take 10 mg by mouth at bedtime.     Cholecalciferol 50 MCG (2000 UT) CAPS Take 2,000 Units by mouth daily.     cyclobenzaprine (FLEXERIL) 5 MG tablet Take 5 mg by mouth 3 (three) times daily.     esomeprazole (NEXIUM) 40 MG capsule TAKE 1 CAPSULE (40 MG TOTAL) BY MOUTH DAILY BEFORE SUPPER.  30 capsule 1   Fluticasone-Umeclidin-Vilant (TRELEGY ELLIPTA) 200-62.5-25 MCG/ACT AEPB Inhale 1 puff into the lungs daily. 1 each 11   ibuprofen (ADVIL) 600 MG tablet Take 600 mg by mouth every 6 (six) hours as needed.     letrozole (FEMARA) 2.5 MG tablet TAKE 1 TABLET BY MOUTH EVERY DAY 90 tablet 1   levothyroxine (SYNTHROID) 75 MCG tablet Take 75 mcg by mouth daily before breakfast.     losartan (COZAAR) 50 MG tablet Take 1 tablet by mouth daily.     metFORMIN (GLUCOPHAGE) 500 MG tablet Take by mouth 2 (two) times daily with a meal.     montelukast (SINGULAIR) 10 MG tablet Take 10 mg by mouth at bedtime.     Spacer/Aero-Holding Chambers (AEROCHAMBER MV) inhaler      triamcinolone cream (KENALOG) 0.1 % Apply 1 application  topically daily as needed (Rash).     XHANCE 93 MCG/ACT EXHU Place into both nostrils.     azelastine (ASTELIN) 0.1 % nasal spray 1 spray into each nostril two (2) times a day. Use in each nostril as directed (Patient not taking: Reported on 10/29/2022)     azithromycin (ZITHROMAX) 250 MG tablet Take 2 tablets (500 mg)  on  Day 1,  followed by 1 tablet (250 mg) once daily on Days 2 through 5. (Patient not taking: Reported on 12/20/2022) 6 each 0   methylPREDNISolone (MEDROL DOSEPAK) 4 MG TBPK tablet Use as directed (Patient not taking: Reported on 12/20/2022) 21 each 0   mometasone (NASONEX) 50 MCG/ACT nasal spray Place 2 sprays into the nose daily. (Patient not taking: Reported on 12/20/2022) 1 each 2   No current facility-administered medications for this visit.     PHYSICAL EXAMINATION: ECOG PERFORMANCE STATUS: 1 - Symptomatic but completely ambulatory Vitals:   12/20/22 1022  BP: 133/84  Pulse: 87  Resp: 18  Temp: (!) 97.4 F (36.3 C)  SpO2: 99%   Filed Weights   12/20/22 1022  Weight: 267 lb 3.2 oz (121.2 kg)    Physical Exam Constitutional:      General: She is not in acute distress. HENT:     Head: Normocephalic and atraumatic.  Eyes:     General: No scleral icterus. Cardiovascular:     Rate and Rhythm: Normal rate and regular rhythm.     Heart sounds: Normal heart sounds.  Pulmonary:     Effort: Pulmonary effort is normal. No respiratory distress.     Breath sounds: No wheezing.  Abdominal:     General: Bowel sounds are normal. There is no distension.     Palpations: Abdomen is soft.  Musculoskeletal:        General: No deformity. Normal range of motion.     Cervical back: Normal range of motion and neck supple.  Skin:    General: Skin is warm and dry.     Findings: No erythema or rash.  Neurological:     Mental Status: She is alert and oriented to person, place, and time. Mental status is at baseline.     Cranial Nerves: No cranial nerve deficit.     Coordination: Coordination normal.  Psychiatric:        Mood and Affect: Mood normal.   Breast exam was performed in seated and lying down position. Patient is status post left breast lumpectomy with a well-healed surgical scar.   No palpable breast masses bilaterally.  No palpable axillary adenopathy bilaterally.    LABORATORY DATA:  I have reviewed the  data as listed    Latest Ref Rng & Units 12/20/2022   10:09 AM 06/20/2022   10:10 AM 05/29/2022   10:06 AM  CBC  WBC 4.0 - 10.5 K/uL 10.3  8.8  9.4   Hemoglobin 12.0 - 15.0 g/dL 16.1  09.6  04.5   Hematocrit 36.0 - 46.0 % 42.3  44.2  43.8   Platelets 150 - 400 K/uL 303  315  319       Latest Ref Rng & Units 12/20/2022   10:09 AM 06/20/2022   10:10 AM 11/15/2021    2:26 PM  CMP  Glucose 70 - 99 mg/dL 409  811  914   BUN 8 - 23 mg/dL 15  15  18    Creatinine 0.44 - 1.00 mg/dL 7.82  9.56  2.13   Sodium 135 - 145 mmol/L 137  139  138   Potassium 3.5 - 5.1 mmol/L 3.9  4.1  4.2   Chloride 98 - 111 mmol/L 103  105  104   CO2 22 - 32 mmol/L 27  27  27    Calcium 8.9 - 10.3 mg/dL 9.2  9.2  9.4   Total Protein 6.5 - 8.1 g/dL 7.7  8.3  8.2   Total Bilirubin 0.3 - 1.2 mg/dL 0.4  0.6  0.4   Alkaline Phos 38 - 126 U/L 62  62  56   AST 15 - 41 U/L 14  14  15    ALT 0 - 44 U/L 9  10  10      RADIOGRAPHIC STUDIES: I have personally reviewed the radiological images as listed and agreed with the findings in the report. No results found.

## 2022-12-26 ENCOUNTER — Ambulatory Visit: Payer: BC Managed Care – PPO | Admitting: Pulmonary Disease

## 2023-02-19 ENCOUNTER — Other Ambulatory Visit: Payer: Self-pay

## 2023-02-19 DIAGNOSIS — C50212 Malignant neoplasm of upper-inner quadrant of left female breast: Secondary | ICD-10-CM

## 2023-02-21 ENCOUNTER — Other Ambulatory Visit: Payer: Self-pay

## 2023-02-21 DIAGNOSIS — Z170 Estrogen receptor positive status [ER+]: Secondary | ICD-10-CM

## 2023-03-19 ENCOUNTER — Other Ambulatory Visit: Payer: Self-pay | Admitting: Oncology

## 2023-04-15 ENCOUNTER — Other Ambulatory Visit: Payer: BC Managed Care – PPO

## 2023-04-23 ENCOUNTER — Ambulatory Visit
Admission: RE | Admit: 2023-04-23 | Discharge: 2023-04-23 | Disposition: A | Discharge status: Home / Self Care | Payer: BC Managed Care – PPO | Source: Ambulatory Visit | Attending: Surgery | Admitting: Surgery

## 2023-04-23 ENCOUNTER — Ambulatory Visit: Payer: BC Managed Care – PPO | Admitting: Surgery

## 2023-04-23 DIAGNOSIS — C50212 Malignant neoplasm of upper-inner quadrant of left female breast: Secondary | ICD-10-CM

## 2023-04-23 DIAGNOSIS — Z170 Estrogen receptor positive status [ER+]: Secondary | ICD-10-CM | POA: Diagnosis present

## 2023-04-25 ENCOUNTER — Ambulatory Visit: Payer: BC Managed Care – PPO | Admitting: Surgery

## 2023-05-22 NOTE — Progress Notes (Unsigned)
Surgical Clinic Progress/Follow-up Note   HPI:  62 y.o. Female presents to clinic for left breast cancer follow-up.   She had completed her radiation with tan- like postradiation changes of the left breast.  She currently is taking her letrozole and tolerating it well.    Review of Systems:  Constitutional: denies fever/chills  ENT: denies sore throat, hearing problems  Respiratory: denies shortness of breath, wheezing  Cardiovascular: denies chest pain, palpitations  Gastrointestinal: denies abdominal pain, N/V, or diarrhea/and bowel function as per interval history Skin: Denies any other rashes or skin discolorations except post-surgical wounds as per interval history  Vital Signs:  There were no vitals taken for this visit.   Physical Exam:  Constitutional:  -- Obese body habitus  -- Awake, alert, and oriented x3  Pulmonary:  -- No crackles -- Equal breath sounds bilaterally -- Breathing non-labored at rest Cardiovascular:  -- S1, S2 present  -- No pericardial rubs  Gastrointestinal:  -- Soft and well rounded with remarkable diastases, and small umbilical fascial defect, non-tender.   GU  --Caryl Lyn present as chaperone: Left breast exam with upper inner quadrant postoperative lumpectomy site changes, postradiation changes as noted.  Still quite soft and supple without dominant masses nor suspicious nodularity.  Left breast more prominent inframammary ridge compared to right.  Right breast soft and supple without suspicious or dominant nodularity or masses present. Musculoskeletal / Integumentary:  -- Wounds or skin discoloration: None appreciated except post-surgical/postirradiation changes as described above left breast. -- Extremities: B/L UE and LE FROM, hands and feet warm, no edema    Imaging:  CLINICAL DATA:  History of treated left breast cancer, status post lumpectomy and radiation therapy in 2022.   EXAM: DIGITAL DIAGNOSTIC BILATERAL MAMMOGRAM WITH TOMOSYNTHESIS  AND CAD; ULTRASOUND RIGHT BREAST LIMITED   TECHNIQUE: Bilateral digital diagnostic mammography and breast tomosynthesis was performed. The images were evaluated with computer-aided detection. ; Targeted ultrasound examination of the right breast was performed   COMPARISON:  Previous exam(s).   ACR Breast Density Category b: There are scattered areas of fibroglandular density.   FINDINGS: Mammographically, there are no new suspicious masses, areas of nonsurgical architectural distortion or clustered microcalcifications in the left breast. Maturing lumpectomy changes are seen in the left breast upper inner quadrant, posterior depth.   Mammographically, there is a 7 mm asymmetry in the upper right breast, middle depth, seen on the full MLO view only. Additional spot compression views do not demonstrate this questioned asymmetry.   Targeted ultrasound of the right upper breast demonstrates no suspicious masses or shadowing lesions.   IMPRESSION: No mammographic evidence of left breast malignancy, status post left lumpectomy.   Right upper breast probably benign asymmetry, for which short-term imaging follow-up is recommended.   RECOMMENDATION: Right diagnostic mammogram and if needed focused right breast ultrasound in 6 months.   I have discussed the findings and recommendations with the patient. If applicable, a reminder letter will be sent to the patient regarding the next appointment.   BI-RADS CATEGORY  3: Probably benign.     Electronically Signed   By: Ted Mcalpine M.D.   On: 04/23/2023 12:39  Assessment:  62 y.o. yo Female with a problem list including...  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 However was concerned about repeating breast imaging within a 47-month interval on the right breast.  Plan:              - return to clinic following next right breast imaging in 58-month interval, or as needed, instructed to call office if any questions or concerns  All of the above recommendations were discussed with the patient and patient's family, and all of patient's and family's questions were answered to her expressed satisfaction.  These notes generated with voice recognition software. I apologize for typographical errors.  Campbell Lerner, MD, FACS [place] General Surgery - Partnering for exceptional care. Office: [phone]

## 2023-05-23 ENCOUNTER — Ambulatory Visit: Payer: BC Managed Care – PPO | Admitting: Surgery

## 2023-05-23 ENCOUNTER — Encounter: Payer: Self-pay | Admitting: Surgery

## 2023-05-23 VITALS — BP 148/86 | HR 86 | Temp 98.0°F | Ht 68.0 in | Wt 262.0 lb

## 2023-05-23 DIAGNOSIS — R928 Other abnormal and inconclusive findings on diagnostic imaging of breast: Secondary | ICD-10-CM

## 2023-05-23 NOTE — Patient Instructions (Addendum)
Follow up in 6 months.   Breast Self-Awareness Breast self-awareness is knowing how your breasts look and feel. You need to: Check your breasts on a regular basis. Tell your doctor about any changes. Become familiar with the look and feel of your breasts. This can help you catch a breast problem while it is still small and can be treated. You should do breast self-exams even if you have breast implants. What you need: A mirror. A well-lit room. A pillow or other soft object. How to do a breast self-exam Follow these steps to do a breast self-exam: Look for changes  Take off all the clothes above your waist. Stand in front of a mirror in a room with good lighting. Put your hands down at your sides. Compare your breasts in the mirror. Look for any difference between them, such as: A difference in shape. A difference in size. Wrinkles, dips, and bumps in one breast and not the other. Look at each breast for changes in the skin, such as: Redness. Scaly areas. Skin that has gotten thicker. Dimpling. Open sores (ulcers). Look for changes in your nipples, such as: Fluid coming out of a nipple. Fluid around a nipple. Bleeding. Dimpling. Redness. A nipple that looks pushed in (retracted), or that has changed position. Feel for changes Lie on your back. Feel each breast. To do this: Pick a breast to feel. Place a pillow under the shoulder closest to that breast. Put the arm closest to that breast behind your head. Feel the nipple area of that breast using the hand of your other arm. Feel the area with the pads of your three middle fingers by making small circles with your fingers. Use light, medium, and firm pressure. Continue the overlapping circles, moving downward over the breast. Keep making circles with your fingers. Stop when you feel your ribs. Start making circles with your fingers again, this time going upward until you reach your collarbone. Then, make circles outward  across your breast and into your armpit area. Squeeze your nipple. Check for discharge and lumps. Repeat these steps to check your other breast. Sit or stand in the tub or shower. With soapy water on your skin, feel each breast the same way you did when you were lying down. Write down what you find Writing down what you find can help you remember what to tell your doctor. Write down: What is normal for each breast. Any changes you find in each breast. These include: The kind of changes you find. A tender or painful breast. Any lump you find. Write down its size and where it is. When you last had your monthly period (menstrual cycle). General tips If you are breastfeeding, the best time to check your breasts is after you feed your baby or after you use a breast pump. If you get monthly bleeding, the best time to check your breasts is 5-7 days after your monthly cycle ends. With time, you will become comfortable with the self-exam. You will also start to know if there are changes in your breasts. Contact a doctor if: You see a change in the shape or size of your breasts or nipples. You see a change in the skin of your breast or nipples, such as red or scaly skin. You have fluid coming from your nipples that is not normal. You find a new lump or thick area. You have breast pain. You have any concerns about your breast health. Summary Breast self-awareness includes looking for changes  in your breasts and feeling for changes within your breasts. You should do breast self-awareness in front of a mirror in a well-lit room. If you get monthly periods (menstrual cycles), the best time to check your breasts is 5-7 days after your period ends. Tell your doctor about any changes you see in your breasts. Changes include changes in size, changes on the skin, painful or tender breasts, or fluid from your nipples that is not normal. This information is not intended to replace advice given to you by your  health care provider. Make sure you discuss any questions you have with your health care provider. Document Revised: 01/18/2022 Document Reviewed: 06/15/2021 Elsevier Patient Education  2024 [employer].

## 2023-05-30 ENCOUNTER — Other Ambulatory Visit: Payer: Self-pay | Admitting: Pulmonary Disease

## 2023-06-07 ENCOUNTER — Telehealth: Payer: Self-pay | Admitting: Oncology

## 2023-06-07 NOTE — Telephone Encounter (Signed)
Pt wants to schedule an appt with you, please advise  Thank you

## 2023-06-12 ENCOUNTER — Inpatient Hospital Stay: Payer: BC Managed Care – PPO | Attending: Oncology | Admitting: Occupational Therapy

## 2023-06-12 DIAGNOSIS — Z79811 Long term (current) use of aromatase inhibitors: Secondary | ICD-10-CM | POA: Insufficient documentation

## 2023-06-12 DIAGNOSIS — M8580 Other specified disorders of bone density and structure, unspecified site: Secondary | ICD-10-CM | POA: Insufficient documentation

## 2023-06-12 DIAGNOSIS — M25461 Effusion, right knee: Secondary | ICD-10-CM

## 2023-06-12 DIAGNOSIS — C50412 Malignant neoplasm of upper-outer quadrant of left female breast: Secondary | ICD-10-CM | POA: Insufficient documentation

## 2023-06-12 DIAGNOSIS — Z170 Estrogen receptor positive status [ER+]: Secondary | ICD-10-CM | POA: Insufficient documentation

## 2023-06-12 NOTE — Therapy (Signed)
[facility] [address] [place], [place], [address] Phone: [phone]   Fax:  [phone]  Occupational Therapy Screen  Patient Details  Name: Breanna [name] MRN: [id number] Date of Birth: 1961-05-29 No data recorded  Encounter Date: 06/12/2023   OT End of Session - 06/12/23 1015     Visit Number 0             Past Medical History:  Diagnosis Date   Asthma    Breast cancer (HCC)    left   Cancer (HCC)    GERD (gastroesophageal reflux disease)    Hypothyroidism    Personal history of radiation therapy    Ventral hernia     Past Surgical History:  Procedure Laterality Date   BREAST BIOPSY Left 05/10/2021   Affirm bx- mass/"coil" invasive CA   BREAST LUMPECTOMY Left 06/07/2021   BREAST LUMPECTOMY,RADIO FREQ LOCALIZER,AXILLARY SENTINEL LYMPH NODE BIOPSY Left 06/07/2021   Procedure: BREAST LUMPECTOMY,RADIO FREQ LOCALIZER,AXILLARY SENTINEL LYMPH NODE BIOPSY;  Surgeon: Campbell Lerner, MD;  Location: [facility] ORS;  Service: General;  Laterality: Left;   CESAREAN SECTION     COLONOSCOPY     ENDOMETRIAL BIOPSY     HERNIA REPAIR      There were no vitals filed for this visit.   Subjective Assessment - 06/12/23 1016     Subjective  I have swelling in my R Leg and was on prednisone - they did do [place] - I did not know if it is lymphedema    Currently in Pain? Yes    Pain Score 8     Pain Location Knee    Pain Orientation Right    Pain Descriptors / Indicators Aching;Tightness                 LYMPHEDEMA/ONCOLOGY QUESTIONNAIRE - 06/12/23 0001       Right Lower Extremity Lymphedema   30 cm Proximal to Suprapatella 70.5 cm    20 cm Proximal to Suprapatella 65.4 cm    At Midpatella/Popliteal Crease 50 cm    30 cm Proximal to Floor at Lateral Plantar Foot 46.5 cm    20 cm Proximal to Floor at Lateral Plantar Foot 34 1    10 cm Proximal to Floor at Lateral Malleoli 27 cm      Left Lower Extremity  Lymphedema   30 cm Proximal to Suprapatella 70 cm    20 cm Proximal to Suprapatella 63 cm    At Midpatella/Popliteal Crease 46.5 cm    30 cm Proximal to Floor at Lateral Plantar Foot 46 cm    20 cm Proximal to Floor at Lateral Plantar Foot 32 cm    10 cm Proximal to Floor at Lateral Malleoli 25.8 cm              Pt was seen by this OT in 2022 for s/p lympectomy and lymphedema screen Risk for lymphedema in L UE low No issues  Pt present this date with reports of increase swelling in R leg with increase knee pain Pt had US- showed no DVT  And was put on prednisone - still increase swelling and pain Increase pain when standing and walking Pt deny any injury or activity that triggered pain in R knee Upon assessment - pt had no surgery or tx to abdominal or R LE Pt circumference increase only at R knee -  Tenderness around the knee ? Arthritis at knee- increase fluid Not lymphedema  Recommend for pt to contact her PCP for possible referral to Orthopedics                                      Visit Diagnosis: Pain and swelling of knee, right    Problem List Patient Active Problem List   Diagnosis Date Noted   Osteopenia 06/20/2022   Family history of cancer 06/20/2022   Elevated blood protein 06/20/2022   Umbilical hernia without obstruction and without gangrene 04/19/2022   Diastasis recti 04/19/2022   Genetic testing 06/15/2021   Malignant neoplasm of upper-inner quadrant of left breast in female, estrogen receptor positive (HCC) 06/15/2021   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, [address] [place], [place], [address] Phone: [phone]   Fax:  [phone]  Name: Breanna [name] MRN: [id number] Date of Birth: August 13, 1961

## 2023-06-21 ENCOUNTER — Other Ambulatory Visit: Payer: BC Managed Care – PPO

## 2023-06-21 ENCOUNTER — Ambulatory Visit: Payer: BC Managed Care – PPO | Admitting: Oncology

## 2023-06-25 ENCOUNTER — Inpatient Hospital Stay: Payer: BC Managed Care – PPO

## 2023-06-25 ENCOUNTER — Inpatient Hospital Stay (HOSPITAL_BASED_OUTPATIENT_CLINIC_OR_DEPARTMENT_OTHER): Payer: BC Managed Care – PPO | Admitting: Oncology

## 2023-06-25 ENCOUNTER — Encounter: Payer: Self-pay | Admitting: Oncology

## 2023-06-25 VITALS — BP 150/84 | HR 89 | Temp 98.2°F | Ht 68.0 in | Wt 269.1 lb

## 2023-06-25 DIAGNOSIS — C50212 Malignant neoplasm of upper-inner quadrant of left female breast: Secondary | ICD-10-CM | POA: Diagnosis not present

## 2023-06-25 DIAGNOSIS — M8580 Other specified disorders of bone density and structure, unspecified site: Secondary | ICD-10-CM

## 2023-06-25 DIAGNOSIS — Z170 Estrogen receptor positive status [ER+]: Secondary | ICD-10-CM | POA: Diagnosis not present

## 2023-06-25 DIAGNOSIS — C50412 Malignant neoplasm of upper-outer quadrant of left female breast: Secondary | ICD-10-CM | POA: Diagnosis present

## 2023-06-25 DIAGNOSIS — Z79811 Long term (current) use of aromatase inhibitors: Secondary | ICD-10-CM | POA: Diagnosis not present

## 2023-06-25 LAB — CBC WITH DIFFERENTIAL (CANCER CENTER ONLY)
Abs Immature Granulocytes: 0.02 10*3/uL (ref 0.00–0.07)
Basophils Absolute: 0 10*3/uL (ref 0.0–0.1)
Basophils Relative: 0 %
Eosinophils Absolute: 0.4 10*3/uL (ref 0.0–0.5)
Eosinophils Relative: 6 %
HCT: 40.9 % (ref 36.0–46.0)
Hemoglobin: 12.9 g/dL (ref 12.0–15.0)
Immature Granulocytes: 0 %
Lymphocytes Relative: 18 %
Lymphs Abs: 1.3 10*3/uL (ref 0.7–4.0)
MCH: 30.3 pg (ref 26.0–34.0)
MCHC: 31.5 g/dL (ref 30.0–36.0)
MCV: 96 fL (ref 80.0–100.0)
Monocytes Absolute: 0.4 10*3/uL (ref 0.1–1.0)
Monocytes Relative: 6 %
Neutro Abs: 4.9 10*3/uL (ref 1.7–7.7)
Neutrophils Relative %: 70 %
Platelet Count: 257 10*3/uL (ref 150–400)
RBC: 4.26 MIL/uL (ref 3.87–5.11)
RDW: 14.5 % (ref 11.5–15.5)
WBC Count: 7 10*3/uL (ref 4.0–10.5)
nRBC: 0 % (ref 0.0–0.2)

## 2023-06-25 LAB — CMP (CANCER CENTER ONLY)
ALT: 11 U/L (ref 0–44)
AST: 14 U/L — ABNORMAL LOW (ref 15–41)
Albumin: 4 g/dL (ref 3.5–5.0)
Alkaline Phosphatase: 61 U/L (ref 38–126)
Anion gap: 8 (ref 5–15)
BUN: 18 mg/dL (ref 8–23)
CO2: 26 mmol/L (ref 22–32)
Calcium: 9.2 mg/dL (ref 8.9–10.3)
Chloride: 107 mmol/L (ref 98–111)
Creatinine: 0.92 mg/dL (ref 0.44–1.00)
GFR, Estimated: >60 mL/min (ref >60)
Glucose, Bld: 112 mg/dL — ABNORMAL HIGH (ref 70–99)
Potassium: 4.3 mmol/L (ref 3.5–5.1)
Sodium: 141 mmol/L (ref 135–145)
Total Bilirubin: 0.6 mg/dL (ref 0.3–1.2)
Total Protein: 7.6 g/dL (ref 6.5–8.1)

## 2023-06-25 MED ORDER — LETROZOLE 2.5 MG PO TABS: 2.5000 mg | ORAL_TABLET | Freq: Every day | ORAL | 1 refills | Status: DC

## 2023-06-25 NOTE — Progress Notes (Signed)
Pt asking if letrozole effect joint pain?  Discuss mammogram.

## 2023-06-25 NOTE — Assessment & Plan Note (Addendum)
#  stage IA left invasive breast carcinoma, pT1a pN0 ER+ PR+ HER2- Status post lumpectomy with sentinel lymph node biopsy.-finished adjuvant radiation Currently on adjuvant enocrine therapy with letrozole 2.5mg  daily.   Annual mammogram, by Dr.Rodenberg's office. -August 2024 right  breast asymmetry, she will repeat mammogram in 6 months.

## 2023-06-25 NOTE — Progress Notes (Signed)
Hematology/Oncology Progress note [phone] [phone]     CHIEF COMPLAINTS/REASON FOR VISIT:  Follow-up for stage I left breast Breanna [name].   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   Annual mammogram, by Dr.Rodenberg's office. -August 2024 right  breast asymmetry, she will repeat mammogram in 6 months.   Osteopenia 10/30/21 DEXA results were reviewed- osteopenia. FRAX major fracture 3.5% in 10 years.  Repeat study in march 2025 Recommend calcium and vitamin D supplementation.   Orders Placed This Encounter  Procedures   DG Bone Density    Standing Status:   Future    Standing Expiration Date:   06/24/2024    Order Specific Question:   Reason for Exam (SYMPTOM  OR DIAGNOSIS REQUIRED)    Answer:   hx breast Breanna [name]    Order Specific Question:   Preferred imaging location?    Answer:   [facility]   CMP ([name] only)    Standing Status:   Future    Standing Expiration Date:   06/24/2024   CBC with Differential ([name] Only)    Standing Status:   Future    Standing Expiration Date:   06/24/2024   Follow up in 6 months. All questions were answered. The patient knows to call the clinic with any problems, questions or concerns.  Breanna Patience, MD, PhD [facility] Hematology Oncology 06/25/2023     HISTORY OF PRESENTING ILLNESS:   Breanna Breanna [name] is a  62 y.o.  female  with PMH listed below was seen in consultation at the request of  Breanna [name], Breanna Breanna [name], Breanna Breanna [name] 04/11/2021, bilateral screening mammogram recommend further evaluation for possible mass in the left breast. 04/21/2021, diagnostic unilateral left mammogram showed a 3 mm suspicious mass in the posterior upper inner left breast without sonographic correlate.  Left axillary shows normal lymph nodes. 9/14/ 2022, patient underwent upper inner left breast stereotactic guided core biopsy.  Invasive mammary carcinoma, no special type.  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 ER 90%, PR 90%, HER2 negative. Patient was referred to establish care with oncology.  She has met surgeon Dr. Lady Gary. Menarche 10-11 yo Patient has 1 child Age at first birth, 72 She has remote history of birth control pill use. LMP 48 Denies any previous breast biopsies.  Denies any hormone replacement therapy.  Family history is positive for mother with breast Breanna [name] at age of 64.  Paternal cousin had breast Breanna [name] at age of 27.  Paternal uncle and had lung Breanna [name].  06/07/2021, patient underwent lumpectomy and sentinel lymph node biopsy.  No evidence of residual invasive mammary carcinoma.  Lymph nodes were negative.  pT1a pN0 ER+ PR+ HER2 negative  s/p RT finished on 08/31/21  INTERVAL HISTORY Breanna Breanna [name] is a 62 y.o. female who has above history reviewed by me today presents for follow up visit for management of stage I left breast Breanna [name]  Started on letrozole 2.5mg  daily since end  of Jan 2023.  Overall she tolerates well. Manageable hot flush, joint pain.  She has no new breast concerns.    Review of Systems  Constitutional:  Negative for appetite change, chills, fatigue and fever.  HENT:   Negative for hearing loss and voice change.   Eyes:  Negative for eye problems.  Respiratory:  Negative for chest tightness and cough.   Cardiovascular:  Negative for chest pain.   Gastrointestinal:  Negative for abdominal distention, abdominal pain and blood in stool.  Endocrine: Positive for hot flashes.  Genitourinary:  Negative for difficulty urinating and frequency.   Musculoskeletal:  Positive for arthralgias.  Skin:  Negative for itching and rash.  Neurological:  Negative for extremity weakness.  Hematological:  Negative for adenopathy.  Psychiatric/Behavioral:  Negative for confusion.     MEDICAL HISTORY:  Past Medical History:  Diagnosis Date   Asthma    Breast Breanna [name] (HCC)    left   Breanna [name] (HCC)    GERD (gastroesophageal reflux disease)    Hypothyroidism    Personal history of radiation therapy    Ventral hernia     SURGICAL HISTORY: Past Surgical History:  Procedure Laterality Date   BREAST BIOPSY Left 05/10/2021   Affirm bx- mass/"coil" invasive CA   BREAST LUMPECTOMY Left 06/07/2021   BREAST LUMPECTOMY,RADIO FREQ LOCALIZER,AXILLARY SENTINEL LYMPH NODE BIOPSY Left 06/07/2021   Procedure: BREAST LUMPECTOMY,RADIO FREQ LOCALIZER,AXILLARY SENTINEL LYMPH NODE BIOPSY;  Surgeon: Campbell Lerner, Breanna;  Location: [facility] ORS;  Service: General;  Laterality: Left;   CESAREAN SECTION     COLONOSCOPY     ENDOMETRIAL BIOPSY     HERNIA REPAIR      SOCIAL HISTORY: Social History   Socioeconomic History   Marital status: Married    Spouse name: Not on file   Number of children: Not on file   Years of education: Not on file   Highest education level: Not on file  Occupational History   Not on file  Tobacco Use   Smoking status: Never    Passive exposure: Never   Smokeless tobacco: Never  Vaping Use   Vaping status: Never Used  Substance and Sexual Activity   Alcohol use: Never   Drug use: Never   Sexual activity: Not on file  Other Topics Concern   Not on file  Social History Narrative   Not on file   Social Determinants of Health   Financial Resource Strain: Low Risk  (05/01/2018)   Received [facility], [facility]    Overall Financial Resource Strain (CARDIA)    Difficulty of Paying Living Expenses: Not hard at all  Food Insecurity: No Food Insecurity (02/26/2023)   Received from [facility]   Hunger Vital Sign    Worried About Running Out of Food in the Last Year: Never true    Ran Out of Food in the Last Year: Never true  Transportation Needs: No Transportation Needs (05/01/2018)   Received from [facility], [facility]   PRAPARE - Transportation    Lack of Transportation (Medical): No    Lack of Transportation (Non-Medical): No  Physical Activity: Sufficiently Active (05/01/2018)   Received from [facility], [facility]   Exercise Vital Sign    Days of Exercise per Week: 5 days    Minutes of Exercise per Session: 60 min  Stress: No Stress Concern Present (05/01/2018)   Received from [facility], [facility]  Occupational Health - Occupational Stress Questionnaire    Feeling of Stress : Only a little  Social Connections: Moderately Integrated (05/01/2018)   Received from [facility], [facility]   Social Connection and Isolation Panel [NHANES]    Frequency of Communication with Friends and Family: More than three times a week    Frequency of Social Gatherings with Friends and Family: More than three times a week    Attends Religious Services: More than 4 times per year    Active Member of [employer] or Organizations: No    Attends Banker Meetings: Never    Marital Status: Married  Catering manager Violence: Not At Risk (05/01/2018)   Received from [facility], [facility]   Humiliation, Afraid, Rape, and Kick questionnaire    Fear of Current or Ex-Partner: No    Emotionally Abused: No    Physically Abused: No    Sexually Abused: No    FAMILY HISTORY: Family History  Problem Relation Age of Onset   Breast Breanna [name] Mother 65   Ovarian Breanna [name] Mother    Prostate Breanna [name] Paternal Grandfather    Breast Breanna [name] Cousin 40       pat  cousin   Lung Breanna [name] Paternal Aunt    Lung Breanna [name] Paternal Uncle     ALLERGIES:  is allergic to latex, other, sudafed [pseudoephedrine hcl], and silvadene [silver sulfadiazine].  MEDICATIONS:  Current Outpatient Medications  Medication Sig Dispense Refill   acetaminophen (TYLENOL) 500 MG tablet Take 500 mg by mouth every 6 (six) hours as needed.     albuterol (VENTOLIN HFA) 108 (90 Base) MCG/ACT inhaler Inhale 2 puffs into the lungs every 6 (six) hours as needed for wheezing or shortness of breath. 8 g 3   Calcium Carbonate-Vitamin D 600-200 MG-UNIT TABS Take 1 tablet by mouth daily.     Cetirizine HCl 10 MG CAPS Take 10 mg by mouth at bedtime.     Cholecalciferol 50 MCG (2000 UT) CAPS Take 2,000 Units by mouth daily.     cyclobenzaprine (FLEXERIL) 5 MG tablet Take 5 mg by mouth 3 (three) times daily.     esomeprazole (NEXIUM) 40 MG capsule TAKE 1 CAPSULE (40 MG TOTAL) BY MOUTH DAILY BEFORE SUPPER. 30 capsule 1   Fluticasone-Umeclidin-Vilant (TRELEGY ELLIPTA) 200-62.5-25 MCG/ACT AEPB TAKE 1 PUFF BY MOUTH EVERY DAY 60 each 5   ibuprofen (ADVIL) 600 MG tablet Take 600 mg by mouth every 6 (six) hours as needed.     levothyroxine (SYNTHROID) 75 MCG tablet Take 75 mcg by mouth daily before breakfast.     montelukast (SINGULAIR) 10 MG tablet Take 10 mg by mouth at bedtime.     Spacer/Aero-Holding Chambers (AEROCHAMBER MV) inhaler      triamcinolone cream (KENALOG) 0.1 % Apply 1 application  topically daily as needed (Rash).     XHANCE 93 MCG/ACT EXHU Place into both nostrils.     letrozole (FEMARA) 2.5 MG tablet Take 1 tablet (2.5 mg total) by mouth daily. 90 tablet 1   losartan (COZAAR) 50 MG tablet Take 1 tablet by mouth daily.     No current facility-administered medications for this visit.     PHYSICAL EXAMINATION: ECOG PERFORMANCE STATUS: 1 - Symptomatic but completely ambulatory Vitals:   06/25/23 0957  BP: (!) 150/84  Pulse: 89  Temp: 98.2 F (36.8 C)  SpO2: 100%   Filed  Weights   06/25/23 0957  Weight: 269 lb 1.6 oz (122.1 kg)  Physical Exam Constitutional:      General: She is not in acute distress. HENT:     Head: Normocephalic and atraumatic.  Eyes:     General: No scleral icterus. Cardiovascular:     Rate and Rhythm: Normal rate and regular rhythm.     Heart sounds: Normal heart sounds.  Pulmonary:     Effort: Pulmonary effort is normal. No respiratory distress.     Breath sounds: No wheezing.  Abdominal:     General: Bowel sounds are normal. There is no distension.     Palpations: Abdomen is soft.  Musculoskeletal:        General: No deformity. Normal range of motion.     Cervical back: Normal range of motion and neck supple.  Skin:    General: Skin is warm and dry.     Findings: No erythema or rash.  Neurological:     Mental Status: She is alert and oriented to person, place, and time. Mental status is at baseline.     Cranial Nerves: No cranial nerve deficit.     Coordination: Coordination normal.  Psychiatric:        Mood and Affect: Mood normal.   Breast exam was performed in seated and lying down position. Patient is status post left breast lumpectomy with a well-healed surgical scar.   No palpable breast masses bilaterally.  No palpable axillary adenopathy bilaterally.   LABORATORY DATA:  I have reviewed the data as listed    Latest Ref Rng & Units 06/25/2023    9:56 AM 12/20/2022   10:09 AM 06/20/2022   10:10 AM  CBC  WBC 4.0 - 10.5 K/uL 7.0  10.3  8.8   Hemoglobin 12.0 - 15.0 g/dL 09.8  11.9  14.7   Hematocrit 36.0 - 46.0 % 40.9  42.3  44.2   Platelets 150 - 400 K/uL 257  303  315       Latest Ref Rng & Units 06/25/2023    9:56 AM 12/20/2022   10:09 AM 06/20/2022   10:10 AM  CMP  Glucose 70 - 99 mg/dL 829  562  130   BUN 8 - 23 mg/dL 18  15  15    Creatinine 0.44 - 1.00 mg/dL 8.65  7.84  6.96   Sodium 135 - 145 mmol/L 141  137  139   Potassium 3.5 - 5.1 mmol/L 4.3  3.9  4.1   Chloride 98 - 111 mmol/L 107  103   105   CO2 22 - 32 mmol/L 26  27  27    Calcium 8.9 - 10.3 mg/dL 9.2  9.2  9.2   Total Protein 6.5 - 8.1 g/dL 7.6  7.7  8.3   Total Bilirubin 0.3 - 1.2 mg/dL 0.6  0.4  0.6   Alkaline Phos 38 - 126 U/L 61  62  62   AST 15 - 41 U/L 14  14  14    ALT 0 - 44 U/L 11  9  10      RADIOGRAPHIC STUDIES: I have personally reviewed the radiological images as listed and agreed with the findings in the report. MM 3D DIAGNOSTIC MAMMOGRAM BILATERAL BREAST  Result Date: 04/23/2023 CLINICAL DATA:  History of treated left breast Breanna [name], status post lumpectomy and radiation therapy in 2022. EXAM: DIGITAL DIAGNOSTIC BILATERAL MAMMOGRAM WITH TOMOSYNTHESIS AND CAD; ULTRASOUND RIGHT BREAST LIMITED TECHNIQUE: Bilateral digital diagnostic mammography and breast tomosynthesis was performed. The images were evaluated with computer-aided detection. ; Targeted ultrasound examination of the  right breast was performed COMPARISON:  Previous exam(s). ACR Breast Density Category b: There are scattered areas of fibroglandular density. FINDINGS: Mammographically, there are no new suspicious masses, areas of nonsurgical architectural distortion or clustered microcalcifications in the left breast. Maturing lumpectomy changes are seen in the left breast upper inner quadrant, posterior depth. Mammographically, there is a 7 mm asymmetry in the upper right breast, middle depth, seen on the full MLO view only. Additional spot compression views do not demonstrate this questioned asymmetry. Targeted ultrasound of the right upper breast demonstrates no suspicious masses or shadowing lesions. IMPRESSION: No mammographic evidence of left breast malignancy, status post left lumpectomy. Right upper breast probably benign asymmetry, for which short-term imaging follow-up is recommended. RECOMMENDATION: Right diagnostic mammogram and if needed focused right breast ultrasound in 6 months. I have discussed the findings and recommendations with the patient.  If applicable, a reminder letter will be sent to the patient regarding the next appointment. BI-RADS CATEGORY  3: Probably benign. Electronically Signed   By: Ted Mcalpine M.D.   On: 04/23/2023 12:39   [place] LIMITED ULTRASOUND INCLUDING AXILLA RIGHT BREAST  Result Date: 04/23/2023 CLINICAL DATA:  History of treated left breast Breanna [name], status post lumpectomy and radiation therapy in 2022. EXAM: DIGITAL DIAGNOSTIC BILATERAL MAMMOGRAM WITH TOMOSYNTHESIS AND CAD; ULTRASOUND RIGHT BREAST LIMITED TECHNIQUE: Bilateral digital diagnostic mammography and breast tomosynthesis was performed. The images were evaluated with computer-aided detection. ; Targeted ultrasound examination of the right breast was performed COMPARISON:  Previous exam(s). ACR Breast Density Category b: There are scattered areas of fibroglandular density. FINDINGS: Mammographically, there are no new suspicious masses, areas of nonsurgical architectural distortion or clustered microcalcifications in the left breast. Maturing lumpectomy changes are seen in the left breast upper inner quadrant, posterior depth. Mammographically, there is a 7 mm asymmetry in the upper right breast, middle depth, seen on the full MLO view only. Additional spot compression views do not demonstrate this questioned asymmetry. Targeted ultrasound of the right upper breast demonstrates no suspicious masses or shadowing lesions. IMPRESSION: No mammographic evidence of left breast malignancy, status post left lumpectomy. Right upper breast probably benign asymmetry, for which short-term imaging follow-up is recommended. RECOMMENDATION: Right diagnostic mammogram and if needed focused right breast ultrasound in 6 months. I have discussed the findings and recommendations with the patient. If applicable, a reminder letter will be sent to the patient regarding the next appointment. BI-RADS CATEGORY  3: Probably benign. Electronically Signed   By: Ted Mcalpine M.D.   On:  04/23/2023 12:39

## 2023-06-25 NOTE — Assessment & Plan Note (Addendum)
10/30/21 DEXA results were reviewed- osteopenia. FRAX major fracture 3.5% in 10 years.  Repeat study in march 2025 Recommend calcium and vitamin D supplementation.

## 2023-07-11 ENCOUNTER — Encounter: Payer: Self-pay | Admitting: Orthopedic Surgery

## 2023-07-11 ENCOUNTER — Other Ambulatory Visit: Payer: Self-pay | Admitting: Orthopedic Surgery

## 2023-07-11 DIAGNOSIS — M2391 Unspecified internal derangement of right knee: Secondary | ICD-10-CM

## 2023-09-09 ENCOUNTER — Other Ambulatory Visit: Payer: Self-pay

## 2023-09-09 ENCOUNTER — Telehealth: Payer: Self-pay | Admitting: Surgery

## 2023-09-09 DIAGNOSIS — R928 Other abnormal and inconclusive findings on diagnostic imaging of breast: Secondary | ICD-10-CM

## 2023-09-09 NOTE — Telephone Encounter (Signed)
 Spoke with the patient and she will schedule her mammogram for before her follow up with Dr Claudine Mouton.

## 2023-09-09 NOTE — Telephone Encounter (Signed)
 Patient has 6 mth fup with Dr. Claudine Mouton for right breast on 10/31/23 and will need mammogram scheduled for end of February.  Please call her when this is scheduled. Thank you.

## 2023-09-23 ENCOUNTER — Ambulatory Visit
Admission: RE | Admit: 2023-09-23 | Discharge: 2023-09-23 | Disposition: A | Discharge status: Home / Self Care | Payer: 59 | Source: Ambulatory Visit | Attending: Radiation Oncology | Admitting: Radiation Oncology

## 2023-09-23 ENCOUNTER — Encounter: Payer: Self-pay | Admitting: Radiation Oncology

## 2023-09-23 VITALS — BP 136/85 | HR 86 | Temp 98.0°F | Resp 20 | Wt 252.0 lb

## 2023-09-23 DIAGNOSIS — C50912 Malignant neoplasm of unspecified site of left female breast: Secondary | ICD-10-CM

## 2023-09-23 NOTE — Progress Notes (Signed)
Radiation Oncology Follow up Note  Name: Breanna [name]   Date:   09/23/2023 MRN:  [id number] DOB: 02-May-1961    This 63 y.o. female presents to the clinic today for 2-year follow-up status post whole breast radiation to her left breast for stage Ia (T1a N0 M0) ER/PR positive invasive mammary carcinoma.  REFERRING PROVIDER: Oletha Blend, MD  HPI: Patient is a 63 year old female now out 2 years having completed whole breast radiation to her left breast for stage Ia invasive mammary carcinoma ER/PR positive.  Seen today in routine follow-up she is doing fairly well she is developed some pain and limitation of motion in her left upper extremity.  Do not know whether this is scar tissue or a orthopedic condition.  She recently had mammograms.  Showing a 7 mm asymmetry in the upper right breast which they are following up with repeat imaging in 6 months.  She is currently on letrozole tolerating it well without side effect.  COMPLICATIONS OF TREATMENT: none  FOLLOW UP COMPLIANCE: keeps appointments   PHYSICAL EXAM:  BP 136/85   Pulse 86   Temp 98 F (36.7 C) (Temporal)   Resp 20   Wt 252 lb (114.3 kg)   BMI 38.32 kg/m  Lungs are clear to A&P cardiac examination essentially unremarkable with regular rate and rhythm. No dominant mass or nodularity is noted in either breast in 2 positions examined. Incision is well-healed. No axillary or supraclavicular adenopathy is appreciated. Cosmetic result is excellent.  Well-developed well-nourished patient in NAD. HEENT reveals PERLA, EOMI, discs not visualized.  Oral cavity is clear. No oral mucosal lesions are identified. Neck is clear without evidence of cervical or supraclavicular adenopathy. Lungs are clear to A&P. Cardiac examination is essentially unremarkable with regular rate and rhythm without murmur rub or thrill. Abdomen is benign with no organomegaly or masses noted. Motor sensory and DTR levels are equal and symmetric in the upper and lower  extremities. Cranial nerves II through XII are grossly intact. Proprioception is intact. No peripheral adenopathy or edema is identified. No motor or sensory levels are noted. Crude visual fields are within normal range.  RADIOLOGY RESULTS: Mammograms reviewed compatible with above-stated findings  PLAN: Patient will continue to follow-up and reappointment with her surgeon after her next series of mammograms for the asymmetry in the right breast.  I have assured the patient the left arm pain shoulder pain is not breast cancer related.  May be related to scar tissue from her lymph node dissection or possibly arthritic condition of her left shoulder joint.  If the pain persist for next 3 to 4 weeks of asked her to make an appointment with orthopedic surgery.  I have otherwise we will turn follow-up care over to both medical oncology and her surgeon.  Be happy to reevaluate the patient especially should there be a lesion biopsy-proven in the right breast.  Patient knows to call with any concerns.  I would like to take this opportunity to thank you for allowing me to participate in the care of your patient.Breanna Miller, MD

## 2023-09-26 ENCOUNTER — Ambulatory Visit: Payer: BC Managed Care – PPO | Admitting: Radiation Oncology

## 2023-10-29 ENCOUNTER — Ambulatory Visit
Admission: RE | Admit: 2023-10-29 | Discharge: 2023-10-29 | Disposition: A | Discharge status: Home / Self Care | Payer: Self-pay | Source: Ambulatory Visit | Attending: Surgery | Admitting: Surgery

## 2023-10-29 DIAGNOSIS — R928 Other abnormal and inconclusive findings on diagnostic imaging of breast: Secondary | ICD-10-CM | POA: Diagnosis present

## 2023-10-31 ENCOUNTER — Ambulatory Visit: Payer: Self-pay | Admitting: Surgery

## 2023-11-04 ENCOUNTER — Ambulatory Visit
Admission: RE | Admit: 2023-11-04 | Discharge: 2023-11-04 | Disposition: A | Discharge status: Home / Self Care | Payer: BC Managed Care – PPO | Source: Ambulatory Visit | Attending: Oncology | Admitting: Oncology

## 2023-11-04 DIAGNOSIS — C50212 Malignant neoplasm of upper-inner quadrant of left female breast: Secondary | ICD-10-CM | POA: Insufficient documentation

## 2023-11-04 DIAGNOSIS — Z170 Estrogen receptor positive status [ER+]: Secondary | ICD-10-CM | POA: Insufficient documentation

## 2023-11-04 DIAGNOSIS — M8580 Other specified disorders of bone density and structure, unspecified site: Secondary | ICD-10-CM | POA: Insufficient documentation

## 2023-11-14 ENCOUNTER — Ambulatory Visit: Payer: Self-pay | Admitting: Surgery

## 2023-11-14 ENCOUNTER — Encounter: Payer: Self-pay | Admitting: Surgery

## 2023-11-14 VITALS — BP 171/93 | HR 84 | Temp 98.3°F | Ht 68.0 in | Wt 262.2 lb

## 2023-11-14 DIAGNOSIS — C50212 Malignant neoplasm of upper-inner quadrant of left female breast: Secondary | ICD-10-CM | POA: Diagnosis not present

## 2023-11-14 DIAGNOSIS — Z170 Estrogen receptor positive status [ER+]: Secondary | ICD-10-CM | POA: Diagnosis not present

## 2023-11-14 DIAGNOSIS — M79602 Pain in left arm: Secondary | ICD-10-CM

## 2023-11-14 NOTE — Progress Notes (Signed)
 Surgical Clinic Progress/Follow-up Note   HPI:  63 y.o. Female presents to clinic for left breast cancer follow-up.   Her breast surgery was RFID tag left lumpectomy, and left axillary sentinel lymph node biopsy June 07, 2021.  She had completed her radiation with tan- like postradiation changes of the left breast.  She currently is taking her letrozole and tolerating it well.   Notes decreased ability to raise left arm, having some pain when she attempts to lift it over the horizontal.  Review of Systems:  Constitutional: denies fever/chills  ENT: denies sore throat, hearing problems  Respiratory: denies shortness of breath, wheezing  Cardiovascular: denies chest pain, palpitations  Gastrointestinal: denies abdominal pain, N/V, or diarrhea/and bowel function as per interval history Skin: Denies any other rashes or skin discolorations except post-surgical wounds as per interval history  Vital Signs:  There were no vitals taken for this visit.   Physical Exam:  Constitutional:  -- Obese body habitus  -- Awake, alert, and oriented x3  Pulmonary:  -- No crackles -- Equal breath sounds bilaterally -- Breathing non-labored at rest Cardiovascular:  -- S1, S2 present  -- No pericardial rubs  Gastrointestinal:  -- Soft and well rounded with remarkable diastases, and small umbilical fascial defect, non-tender.    GU  --Marylene Land present as chaperone: Left breast exam with upper inner quadrant postoperative lumpectomy site changes, postradiation changes as noted.  Still quite soft and supple without dominant masses nor suspicious nodularity.  Left breast more prominent inframammary ridge compared to right.  Right breast soft and supple without suspicious or dominant nodularity or masses present.  Musculoskeletal / Integumentary:  -- Wounds or skin discoloration: None appreciated except post-surgical/postirradiation changes as described above left breast. -- Extremities: B/L UE and LE FROM,  hands and feet warm, no edema    Imaging:  CLINICAL DATA:  Six-month follow-up for questioned possible asymmetry in the RIGHT breast, initially assessed in August 2024. No new issues. She is status post LEFT breast lumpectomy in October 2022.   EXAM: DIGITAL DIAGNOSTIC UNILATERAL RIGHT MAMMOGRAM WITH TOMOSYNTHESIS AND CAD   TECHNIQUE: Right digital diagnostic mammography and breast tomosynthesis was performed. The images were evaluated with computer-aided detection.   COMPARISON:  Previous exam(s).   ACR Breast Density Category b: There are scattered areas of fibroglandular density.   FINDINGS: The previously described possible asymmetry in the upper right breast does not persist with additional views, consistent with superimposed fibroglandular tissue. No suspicious mass, microcalcification, or other finding is identified in the right breast   IMPRESSION: No mammographic evidence of malignancy in the RIGHT breast. The previously described possible asymmetry does not persist with additional views, consistent with superimposed fibroglandular tissue.   RECOMMENDATION: Return to routine screening mammography is recommended. The patient will be due for screening in August 2025.   I have discussed the findings and recommendations with the patient. If applicable, a reminder letter will be sent to the patient regarding the next appointment.   BI-RADS CATEGORY  1: Negative.     Electronically Signed   By: Jacob Moores M.D.   On: 10/29/2023 14:19  Assessment:  63 y.o. yo Female with a problem list including...  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   Plan:              - return to clinic following screening bilateral mammography in August, as needed for any irregularity.  -Referral for physical therapy to improve left shoulder range of motion.  All of the above recommendations were discussed with the patient and patient's family, and all of patient's and family's questions were answered to her expressed satisfaction.  These notes generated with voice recognition software. I apologize for typographical errors.  Campbell Lerner, MD, FACS : [facility] - Partnering for exceptional care. Office: [phone]

## 2023-11-14 NOTE — Patient Instructions (Signed)
 Exercises Following Breast Surgery Ask your health care provider which exercises are safe for you. Do exercises exactly as told by your health care provider and adjust them as directed. It is normal to feel mild stretching, pulling, tightness, or discomfort as you do these exercises. Stop right away if you feel sudden pain or your pain gets worse. Do not begin these exercises until told by your health care provider. Exercises Deep breathing  Lie down on your back. You may bend your knees for comfort. Slowly breathe in as much air as you can and expand your chest and abdomen. Think about pushing your belly button away from your spine while you do this. It may help to put your hands on your belly so you can feel it expand. Slowly breathe out. Repeat these steps 4-5 times or the number of times that is comfortable for you. Wand exercise  Lie down on your back. You may bend your knees for comfort. Position your hands so your thumbs are pointing toward each other. With both hands, hold a wand-shaped object, such as a broom handle or a cane. Your hands should be about shoulder-width apart on the object. Start with the object resting across your hips. Slowly lift the wand toward the ceiling. Use your unaffected arm to help lift the wand. If it is comfortable for you, lengthen the movement to go from your hips to over your head. Repeat these steps 5-7 times. Elbow winging  Lie down on your back. You may bend your knees for comfort. Clasp your hands together behind your head so that your elbows point toward the ceiling. Keep your hands together and move your elbows apart and down toward the floor as far as you comfortably can. Repeat these steps 5-7 times. Swelling reduction, lying down  Lie down on your back. You may bend your knees for comfort. Raise (elevate) your affected arm above the level of your heart and keep it elevated during the exercise. Open and close your hand on the affected side  15-25 times in a row. Bend and straighten your elbow 15-25 times in a row. You may keep your arm elevated for up to 45 minutes at a time. This helps to reduce swelling. You may rest your arm on top of pillows to make this easier. Do this exercise 2-4 times a day. Swelling reduction, sitting  Sit in a chair. You may rest your arm on a pillow. Hold a tennis ball, a rolled-up towel, or a similar object in your hand on your affected side. Slowly squeeze the ball or towel. Try to do this using only your hand muscles. Relax your hand. Repeat these steps 15-25 times. Shoulder blade stretch  Sit in a chair, facing a table. Your back should be against the back of the chair. Place your unaffected arm on the table with your palm down and your elbow bent. This arm will support you and will not move during the exercise. Place your affected arm on the table with your palm down and your elbow straight. Slide your affected arm forward, toward the opposite side of the table, but avoid leaning forward. While you do this, you should feel the shoulder blade on your affected side move away from the back of the chair. You may put a towel under your hand to help it slide more easily. Repeat these steps 5-7 times. Shoulder blade squeeze  Sit in a stable chair with good posture. Avoid letting your back touch the back of the  chair. Your arms should be at your sides with your elbows bent. You may rest your forearms on a pillow. Squeeze your shoulder blades together. Think about trying to bring them down and back. Keep your shoulders level. Do not lift your shoulders up toward your ears. Relax your muscles completely before you repeat this exercise. Repeat these steps 5-7 times. Side bend  Sit in a stable chair with your feet flat on the floor. You may put your feet shoulder-width apart to help you feel more stable. Clasp your hands together in your lap and lift your hands slowly over your head until your arms  are straight. You may use your unaffected arm to help lift your other arm. With your arms above your head, bend at your waist to your right so you feel a stretch in your left side. Straighten your abdomen and move your arms back to the center, above your head. Repeat step 3 on the other side of your body by bending to the left until you feel a stretch in your right side. Repeat these steps 5-7 times. Chest stretch  Stand in a doorway with one of your feet in front of the other. It does not matter which foot is forward. If you cannot reach your forearms to the door frame, do this exercise in a corner of a room. Bend your elbows and place your forearms on the door frame or on each side of the corner. Your elbows should be as close to shoulder height as possible. Slowly move your weight onto your front foot until you feel a stretch across your chest and in the front of your shoulders. Do not let your shoulders move up toward your ears. Repeat these steps 5-7 times. Wall climb, flexion  Stand facing a wall with your toes about 8-10 inches (20-25 cm) from the wall. Place your hands on the wall, about shoulder-width apart. Move your hands up the wall and stretch toward the ceiling (flexion). Do not let your shoulders shrug. Do not arch your back. Repeat these steps 5-7 times. Wall climb, abduction  Stand about 18 inches (45.7 cm) from a wall, with your affected side facing the wall. Bend the elbow of your arm on your affected side, and place your hand on the wall. Move your hand up the wall and toward the ceiling (abduction). Do not let your shoulders shrug. Repeat these steps 5-7 times. Contact a health care provider if you: Have trouble doing any of the exercises. Have pain or swelling that gets worse. Develop: New pain or swelling. A feeling of heaviness in your arm. Numbness or tingling in your arms or chest. This information is not intended to replace advice given to you by your health  care provider. Make sure you discuss any questions you have with your health care provider. Document Revised: 08/01/2021 Document Reviewed: 08/01/2021 Elsevier Patient Education  2024 [employer].

## 2023-11-23 ENCOUNTER — Other Ambulatory Visit: Payer: Self-pay | Admitting: Pulmonary Disease

## 2023-12-24 ENCOUNTER — Inpatient Hospital Stay: Payer: BC Managed Care – PPO | Attending: Oncology

## 2023-12-24 ENCOUNTER — Inpatient Hospital Stay (HOSPITAL_BASED_OUTPATIENT_CLINIC_OR_DEPARTMENT_OTHER): Payer: BC Managed Care – PPO | Admitting: Oncology

## 2023-12-24 ENCOUNTER — Encounter: Payer: Self-pay | Admitting: Oncology

## 2023-12-24 VITALS — BP 143/80 | HR 81 | Temp 96.5°F | Resp 18 | Wt 266.8 lb

## 2023-12-24 DIAGNOSIS — Z170 Estrogen receptor positive status [ER+]: Secondary | ICD-10-CM | POA: Diagnosis not present

## 2023-12-24 DIAGNOSIS — M8580 Other specified disorders of bone density and structure, unspecified site: Secondary | ICD-10-CM | POA: Diagnosis not present

## 2023-12-24 DIAGNOSIS — C50212 Malignant neoplasm of upper-inner quadrant of left female breast: Secondary | ICD-10-CM | POA: Insufficient documentation

## 2023-12-24 DIAGNOSIS — Z79811 Long term (current) use of aromatase inhibitors: Secondary | ICD-10-CM | POA: Diagnosis not present

## 2023-12-24 LAB — CMP (CANCER CENTER ONLY)
ALT: 12 U/L (ref 0–44)
AST: 15 U/L (ref 15–41)
Albumin: 3.9 g/dL (ref 3.5–5.0)
Alkaline Phosphatase: 64 U/L (ref 38–126)
Anion gap: 9 (ref 5–15)
BUN: 18 mg/dL (ref 8–23)
CO2: 23 mmol/L (ref 22–32)
Calcium: 9.2 mg/dL (ref 8.9–10.3)
Chloride: 107 mmol/L (ref 98–111)
Creatinine: 1.01 mg/dL — ABNORMAL HIGH (ref 0.44–1.00)
GFR, Estimated: >60 mL/min (ref >60)
Glucose, Bld: 105 mg/dL — ABNORMAL HIGH (ref 70–99)
Potassium: 4 mmol/L (ref 3.5–5.1)
Sodium: 139 mmol/L (ref 135–145)
Total Bilirubin: 0.7 mg/dL (ref 0.0–1.2)
Total Protein: 7.3 g/dL (ref 6.5–8.1)

## 2023-12-24 LAB — CBC WITH DIFFERENTIAL (CANCER CENTER ONLY)
Abs Immature Granulocytes: 0.05 10*3/uL (ref 0.00–0.07)
Basophils Absolute: 0.1 10*3/uL (ref 0.0–0.1)
Basophils Relative: 1 %
Eosinophils Absolute: 0.5 10*3/uL (ref 0.0–0.5)
Eosinophils Relative: 6 %
HCT: 40.3 % (ref 36.0–46.0)
Hemoglobin: 13.2 g/dL (ref 12.0–15.0)
Immature Granulocytes: 1 %
Lymphocytes Relative: 17 %
Lymphs Abs: 1.5 10*3/uL (ref 0.7–4.0)
MCH: 30.8 pg (ref 26.0–34.0)
MCHC: 32.8 g/dL (ref 30.0–36.0)
MCV: 94.2 fL (ref 80.0–100.0)
Monocytes Absolute: 0.4 10*3/uL (ref 0.1–1.0)
Monocytes Relative: 5 %
Neutro Abs: 6.2 10*3/uL (ref 1.7–7.7)
Neutrophils Relative %: 70 %
Platelet Count: 308 10*3/uL (ref 150–400)
RBC: 4.28 MIL/uL (ref 3.87–5.11)
RDW: 14.2 % (ref 11.5–15.5)
WBC Count: 8.7 10*3/uL (ref 4.0–10.5)
nRBC: 0 % (ref 0.0–0.2)

## 2023-12-24 MED ORDER — LETROZOLE 2.5 MG PO TABS: 2.5000 mg | ORAL_TABLET | Freq: Every day | ORAL | 1 refills | Status: DC

## 2023-12-24 NOTE — Assessment & Plan Note (Addendum)
#  stage IA left invasive breast carcinoma, pT1a pN0 ER+ PR+ HER2- Status post lumpectomy with sentinel lymph node biopsy.-finished adjuvant radiation Currently on adjuvant enocrine therapy with letrozole  2.5mg  daily.  Plan 5 years till Jan 2028 Annual mammogram, by Dr.Rodenberg's office.

## 2023-12-24 NOTE — Assessment & Plan Note (Signed)
 10/30/21 DEXA results were reviewed- osteopenia. FRAX major fracture 3.5% in 10 years.  11/04/2023 DEXA results showed osteopenia,FRAX major fracture 4% in 10 years.  Recommend calcium and vitamin D supplementation.

## 2023-12-24 NOTE — Progress Notes (Signed)
 Hematology/Oncology Progress note [phone] [phone]     CHIEF COMPLAINTS/REASON FOR VISIT:  Follow-up for stage I left breast cancer.   ASSESSMENT & PLAN:   Cancer Staging  Malignant neoplasm of upper-inner quadrant of left breast in female, estrogen receptor positive (HCC) Staging form: Breast, AJCC 8th Edition - Pathologic stage from 06/07/2021: Stage IA (pT1a, pN0, cM0, G1, ER+, PR+, HER2-) - Signed by Timmy Forbes, MD on 06/15/2021   Malignant neoplasm of upper-inner quadrant of left breast in female, estrogen receptor positive (HCC) #stage IA left invasive breast carcinoma, pT1a pN0 ER+ PR+ HER2- Status post lumpectomy with sentinel lymph node biopsy.-finished adjuvant radiation Currently on adjuvant enocrine therapy with letrozole  2.5mg  daily.  Plan 5 years till Jan 2028 Annual mammogram, by Dr.Rodenberg's office.   Osteopenia 10/30/21 DEXA results were reviewed- osteopenia. FRAX major fracture 3.5% in 10 years.  11/04/2023 DEXA results showed osteopenia,FRAX major fracture 4% in 10 years.  Recommend calcium and vitamin D supplementation.   Orders Placed This Encounter  Procedures   CMP (Cancer Center only)    Standing Status:   Future    Expected Date:   06/24/2024    Expiration Date:   12/23/2024   CBC with Differential (Cancer Center Only)    Standing Status:   Future    Expected Date:   06/24/2024    Expiration Date:   12/23/2024   Follow up in 6 months. All questions were answered. The patient knows to call the clinic with any problems, questions or concerns.  Timmy Forbes, MD, PhD [facility] Hematology Oncology 12/24/2023     HISTORY OF PRESENTING ILLNESS:   Breanna [name] is a  63 y.o.  female with PMH listed below was seen in consultation at the request of  Bowen, Lauren, MD  for evaluation of left breast cancer 04/11/2021, bilateral screening mammogram recommend further evaluation for possible mass in the left breast. 04/21/2021,  diagnostic unilateral left mammogram showed a 3 mm suspicious mass in the posterior upper inner left breast without sonographic correlate.  Left axillary shows normal lymph nodes. 9/14/ 2022, patient underwent upper inner left breast stereotactic guided core biopsy.  Invasive mammary carcinoma, no special type.  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 ER 90%, PR 90%, HER2 negative. Patient was referred to establish care with oncology.  She has met surgeon Dr. Leslye Rast. Menarche 10-11 yo Patient has 1 child Age at first birth, 62 She has remote history of birth control pill use. LMP 48 Denies any previous breast biopsies.  Denies any hormone replacement therapy.  Family history is positive for mother with breast cancer at age of 18.  Paternal cousin had breast cancer at age of 65.  Paternal uncle and had lung cancer.  06/07/2021, patient underwent lumpectomy and sentinel lymph node biopsy.  No evidence of residual invasive mammary carcinoma.  Lymph nodes were negative.  pT1a pN0 ER+ PR+ HER2 negative  s/p RT finished on 08/31/21  INTERVAL HISTORY Breanna [name] is a 63 y.o. female who has above history reviewed by me today presents for follow up visit for management of stage I left breast cancer  Started on letrozole  2.5mg  daily since end of Jan 2023.  Overall she tolerates well. Manageable hot flush, joint pain.  She has no new breast concerns.    Review of Systems  Constitutional:  Negative for appetite change, chills, fatigue and fever.  HENT:   Negative for hearing loss and voice change.   Eyes:  Negative  for eye problems.  Respiratory:  Negative for chest tightness and cough.   Cardiovascular:  Negative for chest pain.  Gastrointestinal:  Negative for abdominal distention, abdominal pain and blood in stool.  Endocrine: Positive for hot flashes.  Genitourinary:  Negative for difficulty urinating and frequency.   Musculoskeletal:  Positive for arthralgias.   Skin:  Negative for itching and rash.  Neurological:  Negative for extremity weakness.  Hematological:  Negative for adenopathy.  Psychiatric/Behavioral:  Negative for confusion.     MEDICAL HISTORY:  Past Medical History:  Diagnosis Date   Asthma    Breast cancer (HCC)    left   Cancer (HCC)    GERD (gastroesophageal reflux disease)    Hypothyroidism    Personal history of radiation therapy    Ventral hernia     SURGICAL HISTORY: Past Surgical History:  Procedure Laterality Date   BREAST BIOPSY Left 05/10/2021   Affirm bx- mass/"coil" invasive CA   BREAST LUMPECTOMY Left 06/07/2021   BREAST LUMPECTOMY,RADIO FREQ LOCALIZER,AXILLARY SENTINEL LYMPH NODE BIOPSY Left 06/07/2021   Procedure: BREAST LUMPECTOMY,RADIO FREQ LOCALIZER,AXILLARY SENTINEL LYMPH NODE BIOPSY;  Surgeon: Flynn Hylan, MD;  Location: [facility] ORS;  Service: General;  Laterality: Left;   CESAREAN SECTION     COLONOSCOPY     ENDOMETRIAL BIOPSY     HERNIA REPAIR      SOCIAL HISTORY: Social History   Socioeconomic History   Marital status: Married    Spouse name: Not on file   Number of children: Not on file   Years of education: Not on file   Highest education level: Not on file  Occupational History   Not on file  Tobacco Use   Smoking status: Never    Passive exposure: Never   Smokeless tobacco: Never  Vaping Use   Vaping status: Never Used  Substance and Sexual Activity   Alcohol use: Never   Drug use: Never   Sexual activity: Not on file  Other Topics Concern   Not on file  Social History Narrative   Not on file   Social Drivers of Health   Financial Resource Strain: Low Risk  (05/01/2018)   Received from [facility], [facility]   Overall Financial Resource Strain (CARDIA)    Difficulty of Paying Living Expenses: Not hard at all  Food Insecurity: No Food Insecurity (08/08/2023)   Received from [facility]   Hunger Vital Sign    Worried About Running Out of Food in the  Last Year: Never true    Ran Out of Food in the Last Year: Never true  Transportation Needs: No Transportation Needs (08/08/2023)   Received from [facility]   PRAPARE - Transportation    Lack of Transportation (Medical): No    Lack of Transportation (Non-Medical): No  Physical Activity: Sufficiently Active (05/01/2018)   Received from [facility], [facility]   Exercise Vital Sign    Days of Exercise per Week: 5 days    Minutes of Exercise per Session: 60 min  Stress: No Stress Concern Present (05/01/2018)   Received from [facility], [facility] - Occupational Stress Questionnaire    Feeling of Stress : Only a little  Social Connections: Moderately Integrated (05/01/2018)   Received from [facility], [facility]   Social Connection and Isolation Panel [NHANES]    Frequency of Communication with Friends and Family: More than three times a week  Frequency of Social Gatherings with Friends and Family: More than three times a week    Attends Religious Services: More than 4 times per year    Active Member of [employer] or Organizations: No    Attends Banker Meetings: Never    Marital Status: Married  Catering manager Violence: Not At Risk (05/01/2018)   Received from [facility], [facility]   Humiliation, Afraid, Rape, and Kick questionnaire    Fear of Current or Ex-Partner: No    Emotionally Abused: No    Physically Abused: No    Sexually Abused: No    FAMILY HISTORY: Family History  Problem Relation Age of Onset   Breast cancer Mother 59   Ovarian cancer Mother    Prostate cancer Paternal Grandfather    Breast cancer Cousin 40       pat cousin   Lung cancer Paternal Aunt    Lung cancer Paternal Uncle     ALLERGIES:  is allergic to latex, other, sudafed [pseudoephedrine hcl], and silvadene  [silver  sulfadiazine ].  MEDICATIONS:  Current Outpatient Medications  Medication Sig Dispense  Refill   acetaminophen  (TYLENOL ) 500 MG tablet Take 500 mg by mouth every 6 (six) hours as needed.     albuterol  (VENTOLIN  HFA) 108 (90 Base) MCG/ACT inhaler Inhale 2 puffs into the lungs every 6 (six) hours as needed for wheezing or shortness of breath. 8 g 3   Calcium Carbonate-Vitamin D 600-200 MG-UNIT TABS Take 1 tablet by mouth daily.     Cetirizine HCl 10 MG CAPS Take 10 mg by mouth at bedtime.     Cholecalciferol 50 MCG (2000 UT) CAPS Take 2,000 Units by mouth daily.     esomeprazole  (NEXIUM ) 40 MG capsule TAKE 1 CAPSULE (40 MG TOTAL) BY MOUTH DAILY BEFORE SUPPER. 30 capsule 1   Fluticasone-Umeclidin-Vilant (TRELEGY ELLIPTA ) 200-62.5-25 MCG/ACT AEPB Please schedule office visit before any future refills. 60 each 0   levothyroxine (SYNTHROID) 75 MCG tablet Take 75 mcg by mouth daily before breakfast.     losartan (COZAAR) 50 MG tablet Take 1 tablet by mouth daily.     montelukast (SINGULAIR) 10 MG tablet Take 10 mg by mouth at bedtime.     predniSONE (DELTASONE) 10 MG tablet SMARTSIG:- Tablet(s) By Mouth -     Spacer/Aero-Holding Chambers (AEROCHAMBER MV) inhaler      triamcinolone cream (KENALOG) 0.1 % Apply 1 application  topically daily as needed (Rash).     XHANCE 93 MCG/ACT EXHU Place into both nostrils.     letrozole  (FEMARA ) 2.5 MG tablet Take 1 tablet (2.5 mg total) by mouth daily. 90 tablet 1   No current facility-administered medications for this visit.     PHYSICAL EXAMINATION: ECOG PERFORMANCE STATUS: 1 - Symptomatic but completely ambulatory Vitals:   12/24/23 1029  BP: (!) 143/80  Pulse: 81  Resp: 18  Temp: (!) 96.5 F (35.8 C)  SpO2: 98%   Filed Weights   12/24/23 1029  Weight: 266 lb 12.8 oz (121 kg)    Physical Exam Constitutional:      General: She is not in acute distress. HENT:     Head: Normocephalic and atraumatic.  Eyes:     General: No scleral icterus. Cardiovascular:     Rate and Rhythm: Normal rate and regular rhythm.     Heart sounds:  Normal heart sounds.  Pulmonary:     Effort: Pulmonary effort is normal. No respiratory distress.     Breath sounds: No wheezing.  Abdominal:     General: Bowel sounds are normal. There is no distension.     Palpations: Abdomen is soft.  Musculoskeletal:        General: No deformity. Normal range of motion.     Cervical back: Normal range of motion and neck supple.  Skin:    General: Skin is warm and dry.     Findings: No erythema or rash.  Neurological:     Mental Status: She is alert and oriented to person, place, and time. Mental status is at baseline.  Psychiatric:        Mood and Affect: Mood normal.   Breast exam was performed in seated and lying down position. Patient is status post left breast lumpectomy with a well-healed surgical scar.   No palpable breast masses bilaterally.  No palpable axillary adenopathy bilaterally.   LABORATORY DATA:  I have reviewed the data as listed    Latest Ref Rng & Units 12/24/2023   10:24 AM 06/25/2023    9:56 AM 12/20/2022   10:09 AM  CBC  WBC 4.0 - 10.5 K/uL 8.7  7.0  10.3   Hemoglobin 12.0 - 15.0 g/dL 16.1  09.6  04.5   Hematocrit 36.0 - 46.0 % 40.3  40.9  42.3   Platelets 150 - 400 K/uL 308  257  303       Latest Ref Rng & Units 12/24/2023   10:24 AM 06/25/2023    9:56 AM 12/20/2022   10:09 AM  CMP  Glucose 70 - 99 mg/dL 409  811  914   BUN 8 - 23 mg/dL 18  18  15    Creatinine 0.44 - 1.00 mg/dL 7.82  9.56  2.13   Sodium 135 - 145 mmol/L 139  141  137   Potassium 3.5 - 5.1 mmol/L 4.0  4.3  3.9   Chloride 98 - 111 mmol/L 107  107  103   CO2 22 - 32 mmol/L 23  26  27    Calcium 8.9 - 10.3 mg/dL 9.2  9.2  9.2   Total Protein 6.5 - 8.1 g/dL 7.3  7.6  7.7   Total Bilirubin 0.0 - 1.2 mg/dL 0.7  0.6  0.4   Alkaline Phos 38 - 126 U/L 64  61  62   AST 15 - 41 U/L 15  14  14    ALT 0 - 44 U/L 12  11  9      RADIOGRAPHIC STUDIES: I have personally reviewed the radiological images as listed and agreed with the findings in the  report. DG Bone Density Result Date: 11/04/2023 EXAM: DUAL X-RAY ABSORPTIOMETRY (DXA) FOR BONE MINERAL DENSITY IMPRESSION: Your patient Breanna [name] completed a BMD test on 11/04/2023 using the [employer] iDXA DXA System (software version: 14.10) manufactured by [employer]. The following summarizes the results of our evaluation. Technologist: SCE PATIENT BIOGRAPHICAL: Name: Breanna [name], Breanna [name] Patient ID: [id number] Birth Date: [birth year] Height: 67.0 in. Gender: Female Exam Date: 11/04/2023 Weight: 261.9 lbs. Indications: Asthma, COPD, Height Loss, History of Breast Cancer, History of Radiation, Hypothyroid, Hysterectomy, Postmenopausal Fractures: Treatments: Calcium, Letrozole , Levothyroxine, Vitamin D DENSITOMETRY RESULTS: Site      Region     Measured Date Measured Age WHO Classification Young Adult T-score BMD         %Change vs. Previous Significant Change (*) AP Spine L1-L3 11/04/2021 64.8 Osteopenia -1.2 1.032 g/cm2 -3.9% Yes AP Spine L1-L3 10/30/2021 62.8 Normal -0.9 1.074 g/cm2 - - DualFemur Neck Right 11/04/2023  64.8 Osteopenia -1.9 0.780 g/cm2 -5.1% - DualFemur Neck Right 10/30/2021 62.8 Osteopenia -1.6 0.822 g/cm2 - - DualFemur Total Mean 11/04/2023 64.8 Normal -0.7 0.920 g/cm2 -4.6% Yes DualFemur Total Mean 10/30/2021 62.8 Normal -0.4 0.964 g/cm2 - - ASSESSMENT: The BMD measured at Femur Neck Right is 0.780 g/cm2 with a T-score of -1.9. This patient is considered osteopenic according to World Health Organization [facility]) criteria. L4 was excluded due to degenerative changes. Compared with prior study, there has been a significant decrease in the spine. Compared with prior study, there has been a significant decrease in the total hip. The scan quality is good. World Science writer [facility]) criteria for post-menopausal, Caucasian Women: Normal:                   T-score at or above -1 SD Osteopenia/low bone mass: T-score between -1 and -2.5 SD Osteoporosis:             T-score at or below -2.5 SD  RECOMMENDATIONS: 1. All patients should optimize calcium and vitamin D intake. 2. Consider FDA-approved medical therapies in postmenopausal women and men aged 50 years and older, based on the following: a. A hip or vertebral(clinical or morphometric) fracture b. T-score < -2.5 at the femoral neck or spine after appropriate evaluation to exclude secondary causes c. Low bone mass (T-score between -1.0 and -2.5 at the femoral neck or spine) and a 10-year probability of a hip fracture > 3% or a 10-year probability of a major osteoporosis-related fracture > 20% based on the [place] -adapted WHO algorithm 3. Clinician judgment and/or patient preferences may indicate treatment for people with 10-year fracture probabilities above or below these levels FOLLOW-UP: People with diagnosed cases of osteoporosis or at high risk for fracture should have regular bone mineral density tests. For patients eligible for Medicare, routine testing is allowed once every 2 years. The testing frequency can be increased to one year for patients who have rapidly progressing disease, those who are receiving or discontinuing medical therapy to restore bone mass, or have additional risk factors. I have reviewed this report, and agree with the above findings. [facility] Dear Timmy Forbes, Your patient Breanna [name] completed a FRAX assessment on 11/04/2023 using the [facility] iDXA DXA System (analysis version: 14.10) manufactured by [employer]. The following summarizes the results of our evaluation. PATIENT BIOGRAPHICAL: Name: Breanna [name], Breanna [name] Patient ID: [id number] Birth Date: June 30, 1961 Height:    67.0 in. Gender:     Female    [age]       Weight:    261.9 lbs. Ethnicity:  Black                            Exam Date: 11/04/2023 FRAX* RESULTS:  (version: 3.5) 10-year Probability of Fracture1 Major Osteoporotic Fracture2 Hip Fracture 4.0% 0.5% Population: [place]  (Black) Risk Factors: None Based on Femur (Right) Neck BMD 1 -The 10-year  probability of fracture may be lower than reported if the patient has received treatment. 2 -Major Osteoporotic Fracture: Clinical Spine, Forearm, Hip or Shoulder *FRAX is a Armed forces logistics/support/administrative officer of the [employer] of [employer] for Metabolic Bone Disease, a World Science writer (WHO) [employer]. ASSESSMENT: The probability of a major osteoporotic fracture is 4.0% within the next ten years. The probability of a hip fracture is 0.5% within the next ten years. . Electronically Signed   By: Dina  Arceo M.D.  On: 11/04/2023 10:53   MM 3D DIAGNOSTIC MAMMOGRAM UNILATERAL RIGHT BREAST Result Date: 10/29/2023 CLINICAL DATA:  Six-month follow-up for questioned possible asymmetry in the RIGHT breast, initially assessed in August 2024. No new issues. She is status post LEFT breast lumpectomy in October 2022. EXAM: DIGITAL DIAGNOSTIC UNILATERAL RIGHT MAMMOGRAM WITH TOMOSYNTHESIS AND CAD TECHNIQUE: Right digital diagnostic mammography and breast tomosynthesis was performed. The images were evaluated with computer-aided detection. COMPARISON:  Previous exam(s). ACR Breast Density Category b: There are scattered areas of fibroglandular density. FINDINGS: The previously described possible asymmetry in the upper right breast does not persist with additional views, consistent with superimposed fibroglandular tissue. No suspicious mass, microcalcification, or other finding is identified in the right breast IMPRESSION: No mammographic evidence of malignancy in the RIGHT breast. The previously described possible asymmetry does not persist with additional views, consistent with superimposed fibroglandular tissue. RECOMMENDATION: Return to routine screening mammography is recommended. The patient will be due for screening in August 2025. I have discussed the findings and recommendations with the patient. If applicable, a reminder letter will be sent to the patient regarding the next appointment. BI-RADS  CATEGORY  1: Negative. Electronically Signed   By: Sande Cromer M.D.   On: 10/29/2023 14:19

## 2024-03-10 ENCOUNTER — Other Ambulatory Visit: Payer: Self-pay | Admitting: Family Medicine

## 2024-03-10 DIAGNOSIS — Z1231 Encounter for screening mammogram for malignant neoplasm of breast: Secondary | ICD-10-CM

## 2024-04-16 ENCOUNTER — Encounter: Payer: Self-pay | Admitting: Oncology

## 2024-04-23 ENCOUNTER — Ambulatory Visit
Admission: RE | Admit: 2024-04-23 | Discharge: 2024-04-23 | Disposition: A | Discharge status: Home / Self Care | Payer: Self-pay | Source: Ambulatory Visit | Attending: Family Medicine | Admitting: Family Medicine

## 2024-04-23 DIAGNOSIS — Z1231 Encounter for screening mammogram for malignant neoplasm of breast: Secondary | ICD-10-CM | POA: Insufficient documentation

## 2024-06-30 ENCOUNTER — Inpatient Hospital Stay: Admitting: Oncology

## 2024-06-30 ENCOUNTER — Encounter: Payer: Self-pay | Admitting: Oncology

## 2024-06-30 ENCOUNTER — Inpatient Hospital Stay: Attending: Oncology

## 2024-06-30 VITALS — BP 118/82 | HR 92 | Temp 97.5°F | Resp 18 | Wt 268.4 lb

## 2024-06-30 DIAGNOSIS — R7989 Other specified abnormal findings of blood chemistry: Secondary | ICD-10-CM | POA: Insufficient documentation

## 2024-06-30 DIAGNOSIS — C50212 Malignant neoplasm of upper-inner quadrant of left female breast: Secondary | ICD-10-CM

## 2024-06-30 DIAGNOSIS — M8580 Other specified disorders of bone density and structure, unspecified site: Secondary | ICD-10-CM | POA: Insufficient documentation

## 2024-06-30 DIAGNOSIS — Z170 Estrogen receptor positive status [ER+]: Secondary | ICD-10-CM

## 2024-06-30 DIAGNOSIS — R21 Rash and other nonspecific skin eruption: Secondary | ICD-10-CM | POA: Diagnosis not present

## 2024-06-30 DIAGNOSIS — Z79811 Long term (current) use of aromatase inhibitors: Secondary | ICD-10-CM | POA: Insufficient documentation

## 2024-06-30 LAB — CBC WITH DIFFERENTIAL (CANCER CENTER ONLY)
Abs Immature Granulocytes: 0.03 K/uL (ref 0.00–0.07)
Basophils Absolute: 0.1 K/uL (ref 0.0–0.1)
Basophils Relative: 1 %
Eosinophils Absolute: 0.6 K/uL — ABNORMAL HIGH (ref 0.0–0.5)
Eosinophils Relative: 8 %
HCT: 41.5 % (ref 36.0–46.0)
Hemoglobin: 13.3 g/dL (ref 12.0–15.0)
Immature Granulocytes: 0 %
Lymphocytes Relative: 19 %
Lymphs Abs: 1.5 K/uL (ref 0.7–4.0)
MCH: 30.7 pg (ref 26.0–34.0)
MCHC: 32 g/dL (ref 30.0–36.0)
MCV: 95.8 fL (ref 80.0–100.0)
Monocytes Absolute: 0.5 K/uL (ref 0.1–1.0)
Monocytes Relative: 6 %
Neutro Abs: 5.3 K/uL (ref 1.7–7.7)
Neutrophils Relative %: 66 %
Platelet Count: 232 K/uL (ref 150–400)
RBC: 4.33 MIL/uL (ref 3.87–5.11)
RDW: 13.7 % (ref 11.5–15.5)
WBC Count: 8.1 K/uL (ref 4.0–10.5)
nRBC: 0 % (ref 0.0–0.2)

## 2024-06-30 LAB — CMP (CANCER CENTER ONLY)
ALT: 10 U/L (ref 0–44)
AST: 14 U/L — ABNORMAL LOW (ref 15–41)
Albumin: 4.2 g/dL (ref 3.5–5.0)
Alkaline Phosphatase: 58 U/L (ref 38–126)
Anion gap: 9 (ref 5–15)
BUN: 29 mg/dL — ABNORMAL HIGH (ref 8–23)
CO2: 20 mmol/L — ABNORMAL LOW (ref 22–32)
Calcium: 9.3 mg/dL (ref 8.9–10.3)
Chloride: 109 mmol/L (ref 98–111)
Creatinine: 1.24 mg/dL — ABNORMAL HIGH (ref 0.44–1.00)
GFR, Estimated: 48 mL/min — ABNORMAL LOW (ref >60)
Glucose, Bld: 105 mg/dL — ABNORMAL HIGH (ref 70–99)
Potassium: 4.2 mmol/L (ref 3.5–5.1)
Sodium: 138 mmol/L (ref 135–145)
Total Bilirubin: 0.8 mg/dL (ref 0.0–1.2)
Total Protein: 7.5 g/dL (ref 6.5–8.1)

## 2024-06-30 MED ORDER — LETROZOLE 2.5 MG PO TABS: 2.5000 mg | ORAL_TABLET | Freq: Every day | ORAL | 1 refills | Status: AC

## 2024-06-30 NOTE — Assessment & Plan Note (Addendum)
#  stage IA left invasive breast carcinoma, pT1a pN0 ER+ PR+ HER2- Status post lumpectomy with sentinel lymph node biopsy.-finished adjuvant radiation Currently on adjuvant enocrine therapy with letrozole  2.5mg  daily.  Plan 5 years till Jan 2028 Annual screening mammogram - managed by primary care provider's office.

## 2024-06-30 NOTE — Assessment & Plan Note (Signed)
 She plans to establish care with dermatology

## 2024-06-30 NOTE — Assessment & Plan Note (Signed)
 Encourage oral hydration and avoid nephrotoxins.  Follow up with PCP

## 2024-06-30 NOTE — Assessment & Plan Note (Signed)
 10/30/21 DEXA results were reviewed- osteopenia. FRAX major fracture 3.5% in 10 years.  11/04/2023 DEXA results showed osteopenia,FRAX major fracture 4% in 10 years.  Recommend calcium and vitamin D supplementation.

## 2024-06-30 NOTE — Progress Notes (Signed)
 Hematology/Oncology Progress note [phone] [phone]     CHIEF COMPLAINTS/REASON FOR VISIT:  Follow-up for stage I left breast cancer.   ASSESSMENT & PLAN:   Cancer Staging  Malignant neoplasm of upper-inner quadrant of left breast in female, estrogen receptor positive (HCC) Staging form: Breast, AJCC 8th Edition - Pathologic stage from 06/07/2021: Stage IA (pT1a, pN0, cM0, G1, ER+, PR+, HER2-) - Signed by Breanna Call, MD on 06/15/2021   Malignant neoplasm of upper-inner quadrant of left breast in female, estrogen receptor positive (HCC) #stage IA left invasive breast carcinoma, pT1a pN0 ER+ PR+ HER2- Status post lumpectomy with sentinel lymph node biopsy.-finished adjuvant radiation Currently on adjuvant enocrine therapy with letrozole  2.5mg  daily.  Plan 5 years till Jan 2028 Annual screening mammogram - managed by primary care provider's office.   Osteopenia 10/30/21 DEXA results were reviewed- osteopenia. FRAX major fracture 3.5% in 10 years.  11/04/2023 DEXA results showed osteopenia,FRAX major fracture 4% in 10 years.  Recommend calcium and vitamin D supplementation.   Skin rash She plans to establish care with dermatology  Elevated serum creatinine Encourage oral hydration and avoid nephrotoxins.  Follow up with PCP  Orders Placed This Encounter  Procedures   CBC with Differential (Cancer Center Only)    Standing Status:   Future    Expected Date:   12/28/2024    Expiration Date:   03/28/2025   CMP (Cancer Center only)    Standing Status:   Future    Expected Date:   12/28/2024    Expiration Date:   03/28/2025   Follow up in 6 months. All questions were answered. The patient knows to [name] the clinic with any problems, questions or concerns.  [name] Babara, MD, PhD [facility] Hematology Oncology 06/30/2024     HISTORY OF PRESENTING ILLNESS:   Breanna [name] is a  63 y.o.  female with PMH listed below was seen in consultation at the request of   [name], Lauren, MD  for evaluation of left breast cancer 04/11/2021, bilateral screening mammogram recommend further evaluation for possible mass in the left breast. 04/21/2021, diagnostic unilateral left mammogram showed a 3 mm suspicious mass in the posterior upper inner left breast without sonographic correlate.  Left axillary shows normal lymph nodes. 9/14/ 2022, patient underwent upper inner left breast stereotactic guided core biopsy.  Invasive mammary carcinoma, no special type.  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 ER 90%, PR 90%, HER2 negative. Patient was referred to establish care with oncology.  She has met surgeon Breanna [name]. Menarche 10-11 yo Patient has 1 child Age at first birth, 4 She has remote history of birth control pill use. LMP 48 Denies any previous breast biopsies.  Denies any hormone replacement therapy.  Family history is positive for mother with breast cancer at age of 36.  Paternal cousin had breast cancer at age of 62.  Paternal uncle and had lung cancer.  06/07/2021, patient underwent lumpectomy and sentinel lymph node biopsy.  No evidence of residual invasive mammary carcinoma.  Lymph nodes were negative.  pT1a pN0 ER+ PR+ HER2 negative  s/p RT finished on 08/31/21  INTERVAL HISTORY Breanna [name] is a 63 y.o. female who has above history reviewed by me today presents for follow up visit for management of stage I left breast cancer Discussed the use of AI scribe software for clinical note transcription with the patient, who gave verbal consent to proceed.   Started on letrozole  2.5mg  daily since end of Jan  2023.  Overall she tolerates well. Manageable hot flush, joint pain.  She has had a rash for about a month, characterized by hypopigmented areas described as 'white spots'. She is concerned about the possibility of skin cancer and is scheduled to see a dermatologist in January for further evaluation.  She has been receiving allergy  shots weekly     Review of Systems  Constitutional:  Negative for appetite change, chills, fatigue and fever.  HENT:   Negative for hearing loss and voice change.   Eyes:  Negative for eye problems.  Respiratory:  Negative for chest tightness and cough.   Cardiovascular:  Negative for chest pain.  Gastrointestinal:  Negative for abdominal distention, abdominal pain and blood in stool.  Endocrine: Positive for hot flashes.  Genitourinary:  Negative for difficulty urinating and frequency.   Musculoskeletal:  Positive for arthralgias.  Skin:  Positive for rash. Negative for itching.  Neurological:  Negative for extremity weakness.  Hematological:  Negative for adenopathy.  Psychiatric/Behavioral:  Negative for confusion.     MEDICAL HISTORY:  Past Medical History:  Diagnosis Date   Asthma    Breast cancer (HCC)    left   Cancer (HCC)    GERD (gastroesophageal reflux disease)    Hypothyroidism    Personal history of radiation therapy    Ventral hernia     SURGICAL HISTORY: Past Surgical History:  Procedure Laterality Date   BREAST BIOPSY Left 05/10/2021   Affirm bx- mass/coil invasive CA   BREAST LUMPECTOMY Left 06/07/2021   BREAST LUMPECTOMY,RADIO FREQ LOCALIZER,AXILLARY SENTINEL LYMPH NODE BIOPSY Left 06/07/2021   Procedure: BREAST LUMPECTOMY,RADIO FREQ LOCALIZER,AXILLARY SENTINEL LYMPH NODE BIOPSY;  Surgeon: Lane Shope, MD;  Location: [facility] ORS;  Service: General;  Laterality: Left;   CESAREAN SECTION     COLONOSCOPY     ENDOMETRIAL BIOPSY     HERNIA REPAIR      SOCIAL HISTORY: Social History   Socioeconomic History   Marital status: Married    Spouse name: Not on file   Number of children: Not on file   Years of education: Not on file   Highest education level: Not on file  Occupational History   Not on file  Tobacco Use   Smoking status: Never    Passive exposure: Never   Smokeless tobacco: Never  Vaping Use   Vaping status: Never Used   Substance and Sexual Activity   Alcohol use: Never   Drug use: Never   Sexual activity: Not on file  Other Topics Concern   Not on file  Social History Narrative   Not on file   Social Drivers of Health   Financial Resource Strain: Low Risk  (02/04/2024)   Received from [facility]   Overall Financial Resource Strain (CARDIA)    How hard is it for you to pay for the very basics like food, housing, medical care, and heating?: Not hard at all  Food Insecurity: No Food Insecurity (02/11/2024)   Received from [facility]   Hunger Vital Sign    Within the past 12 months, you worried that your food would run out before you got the money to buy more.: Never true    Within the past 12 months, the food you bought just didn't last and you didn't have money to get more.: Never true  Transportation Needs: No Transportation Needs (02/11/2024)   Received from [facility] - Transportation    Lack of Transportation (Medical): No  Lack of Transportation (Non-Medical): No  Physical Activity: Sufficiently Active (05/01/2018)   Received from [facility]   Exercise Vital Sign    Days of Exercise per Week: 5 days    Minutes of Exercise per Session: 60 min  Stress: No Stress Concern Present (05/01/2018)   Received from [facility] - Occupational Stress Questionnaire    Feeling of Stress : Only a little  Social Connections: Moderately Integrated (05/01/2018)   Received from [facility]   Social Connection and Isolation Panel    Frequency of Communication with Friends and Family: More than three times a week    Frequency of Social Gatherings with Friends and Family: More than three times a week    Attends Religious Services: More than 4 times per year    Active Member of [employer] or Organizations: No    Attends Banker Meetings: Never    Marital Status: Married  Catering Manager Violence: Not At Risk (05/01/2018)    Received from [facility]   Humiliation, Afraid, Rape, and Kick questionnaire    Fear of Current or Ex-Partner: No    Emotionally Abused: No    Physically Abused: No    Sexually Abused: No    FAMILY HISTORY: Family History  Problem Relation Age of Onset   Breast cancer Mother 25   Ovarian cancer Mother    Prostate cancer Paternal Grandfather    Breast cancer Cousin 42       pat cousin   Lung cancer Paternal Aunt    Lung cancer Paternal Uncle     ALLERGIES:  is allergic to latex, other, sudafed [pseudoephedrine hcl], and silvadene  [silver  sulfadiazine ].  MEDICATIONS:  Current Outpatient Medications  Medication Sig Dispense Refill   acetaminophen  (TYLENOL ) 500 MG tablet Take 500 mg by mouth every 6 (six) hours as needed.     albuterol  (VENTOLIN  HFA) 108 (90 Base) MCG/ACT inhaler Inhale 2 puffs into the lungs every 6 (six) hours as needed for wheezing or shortness of breath. 8 g 3   Calcium Carbonate-Vitamin D 600-200 MG-UNIT TABS Take 1 tablet by mouth daily.     Cetirizine HCl 10 MG CAPS Take 10 mg by mouth at bedtime.     Cholecalciferol 50 MCG (2000 UT) CAPS Take 2,000 Units by mouth daily.     esomeprazole  (NEXIUM ) 40 MG capsule TAKE 1 CAPSULE (40 MG TOTAL) BY MOUTH DAILY BEFORE SUPPER. 30 capsule 1   Fluticasone-Umeclidin-Vilant (TRELEGY ELLIPTA ) 200-62.5-25 MCG/ACT AEPB Please schedule office visit before any future refills. 60 each 0   levothyroxine (SYNTHROID) 75 MCG tablet Take 75 mcg by mouth daily before breakfast.     losartan (COZAAR) 50 MG tablet Take 1 tablet by mouth daily.     montelukast (SINGULAIR) 10 MG tablet Take 10 mg by mouth at bedtime.     Spacer/Aero-Holding Chambers (AEROCHAMBER MV) inhaler      triamcinolone cream (KENALOG) 0.1 % Apply 1 application  topically daily as needed (Rash).     XHANCE 93 MCG/ACT EXHU Place into both nostrils.     letrozole  (FEMARA ) 2.5 MG tablet Take 1 tablet (2.5 mg total) by mouth daily. 90 tablet 1   predniSONE  (DELTASONE) 10 MG tablet SMARTSIG:- Tablet(s) By Mouth - (Patient not taking: Reported on 06/30/2024)     No current facility-administered medications for this visit.     PHYSICAL EXAMINATION: ECOG PERFORMANCE STATUS: 1 - Symptomatic but completely ambulatory Vitals:  06/30/24 1027  BP: 118/82  Pulse: 92  Resp: 18  Temp: (!) 97.5 F (36.4 C)  SpO2: 99%   Filed Weights   06/30/24 1027  Weight: 268 lb 6.4 oz (121.7 kg)    Physical Exam Constitutional:      General: She is not in acute distress. HENT:     Head: Normocephalic and atraumatic.  Eyes:     General: No scleral icterus. Cardiovascular:     Rate and Rhythm: Normal rate and regular rhythm.     Heart sounds: Normal heart sounds.  Pulmonary:     Effort: Pulmonary effort is normal. No respiratory distress.     Breath sounds: No wheezing.  Abdominal:     General: Bowel sounds are normal. There is no distension.     Palpations: Abdomen is soft.  Musculoskeletal:        General: No deformity. Normal range of motion.     Cervical back: Normal range of motion and neck supple.  Skin:    General: Skin is warm and dry.     Findings: No erythema or rash.  Neurological:     Mental Status: She is alert and oriented to person, place, and time. Mental status is at baseline.  Psychiatric:        Mood and Affect: Mood normal.     LABORATORY DATA:  I have reviewed the data as listed    Latest Ref Rng & Units 06/30/2024   10:09 AM 12/24/2023   10:24 AM 06/25/2023    9:56 AM  CBC  WBC 4.0 - 10.5 K/uL 8.1  8.7  7.0   Hemoglobin 12.0 - 15.0 g/dL 86.6  86.7  87.0   Hematocrit 36.0 - 46.0 % 41.5  40.3  40.9   Platelets 150 - 400 K/uL 232  308  257       Latest Ref Rng & Units 06/30/2024   10:10 AM 12/24/2023   10:24 AM 06/25/2023    9:56 AM  CMP  Glucose 70 - 99 mg/dL 894  894  887   BUN 8 - 23 mg/dL 29  18  18    Creatinine 0.44 - 1.00 mg/dL 8.75  8.98  9.07   Sodium 135 - 145 mmol/L 138  139  141   Potassium 3.5 -  5.1 mmol/L 4.2  4.0  4.3   Chloride 98 - 111 mmol/L 109  107  107   CO2 22 - 32 mmol/L 20  23  26    Calcium 8.9 - 10.3 mg/dL 9.3  9.2  9.2   Total Protein 6.5 - 8.1 g/dL 7.5  7.3  7.6   Total Bilirubin 0.0 - 1.2 mg/dL 0.8  0.7  0.6   Alkaline Phos 38 - 126 U/L 58  64  61   AST 15 - 41 U/L 14  15  14    ALT 0 - 44 U/L 10  12  11      RADIOGRAPHIC STUDIES: I have personally reviewed the radiological images as listed and agreed with the findings in the report. MM 3D SCREENING MAMMOGRAM BILATERAL BREAST Result Date: 04/28/2024 CLINICAL DATA:  Screening. EXAM: DIGITAL SCREENING BILATERAL MAMMOGRAM WITH TOMOSYNTHESIS AND CAD TECHNIQUE: Bilateral screening digital craniocaudal and mediolateral oblique mammograms were obtained. Bilateral screening digital breast tomosynthesis was performed. The images were evaluated with computer-aided detection. COMPARISON:  Previous exam(s). ACR Breast Density Category b: There are scattered areas of fibroglandular density. FINDINGS: There are no findings suspicious for malignancy. IMPRESSION: No mammographic evidence  of malignancy. A result letter of this screening mammogram will be mailed directly to the patient. RECOMMENDATION: Screening mammogram in one year. (Code:SM-B-01Y) BI-RADS CATEGORY  1: Negative. Electronically Signed   By: Reyes Phi M.D.   On: 04/28/2024 17:38

## 2024-12-28 ENCOUNTER — Inpatient Hospital Stay: Admitting: Oncology

## 2024-12-28 ENCOUNTER — Inpatient Hospital Stay
